# Patient Record
Sex: Male | Born: 1935 | Race: Black or African American | Hispanic: No | Marital: Married | Smoking: Never smoker
Health system: Southern US, Community
[De-identification: ages and names within clinical notes are randomized; demographics above are authoritative.]

## PROBLEM LIST (undated history)

## (undated) DIAGNOSIS — L0390 Cellulitis, unspecified: Secondary | ICD-10-CM

---

## 2002-02-28 ENCOUNTER — Ambulatory Visit (HOSPITAL_COMMUNITY): Admission: RE | Admit: 2002-02-28 | Discharge: 2002-02-28 | Payer: Self-pay | Admitting: Pulmonary Disease

## 2003-08-29 ENCOUNTER — Ambulatory Visit (HOSPITAL_COMMUNITY): Admission: RE | Admit: 2003-08-29 | Discharge: 2003-08-29 | Payer: Self-pay | Admitting: Pulmonary Disease

## 2008-02-07 ENCOUNTER — Encounter: Payer: Self-pay | Admitting: Cardiology

## 2008-02-07 ENCOUNTER — Ambulatory Visit: Payer: Self-pay | Admitting: Cardiology

## 2008-02-08 ENCOUNTER — Inpatient Hospital Stay (HOSPITAL_COMMUNITY): Admission: EM | Admit: 2008-02-08 | Discharge: 2008-02-13 | Payer: Self-pay | Admitting: Emergency Medicine

## 2008-03-23 ENCOUNTER — Ambulatory Visit (HOSPITAL_COMMUNITY): Admission: RE | Admit: 2008-03-23 | Discharge: 2008-03-23 | Payer: Self-pay | Admitting: Pulmonary Disease

## 2008-03-30 ENCOUNTER — Encounter (HOSPITAL_COMMUNITY): Admission: RE | Admit: 2008-03-30 | Discharge: 2008-04-04 | Payer: Self-pay | Admitting: Pulmonary Disease

## 2008-04-05 ENCOUNTER — Encounter (HOSPITAL_COMMUNITY): Admission: RE | Admit: 2008-04-05 | Discharge: 2008-05-05 | Payer: Self-pay | Admitting: Pulmonary Disease

## 2009-06-18 ENCOUNTER — Inpatient Hospital Stay (HOSPITAL_COMMUNITY): Admission: AD | Admit: 2009-06-18 | Discharge: 2009-06-21 | Payer: Self-pay | Admitting: Pulmonary Disease

## 2010-10-07 LAB — CBC
HCT: 34.3 % — ABNORMAL LOW (ref 39.0–52.0)
HCT: 41.1 % (ref 39.0–52.0)
Hemoglobin: 11.6 g/dL — ABNORMAL LOW (ref 13.0–17.0)
Hemoglobin: 13.7 g/dL (ref 13.0–17.0)
MCHC: 33.3 g/dL (ref 30.0–36.0)
MCHC: 33.9 g/dL (ref 30.0–36.0)
Platelets: 151 10*3/uL (ref 150–400)
RBC: 3.9 MIL/uL — ABNORMAL LOW (ref 4.22–5.81)
RDW: 14.4 % (ref 11.5–15.5)

## 2010-10-07 LAB — URINALYSIS, ROUTINE W REFLEX MICROSCOPIC
Bilirubin Urine: NEGATIVE
Glucose, UA: NEGATIVE mg/dL
Hgb urine dipstick: NEGATIVE
Specific Gravity, Urine: 1.01 (ref 1.005–1.030)
Urobilinogen, UA: 0.2 mg/dL (ref 0.0–1.0)
pH: 6 (ref 5.0–8.0)

## 2010-10-07 LAB — CULTURE, BLOOD (ROUTINE X 2)
Culture: NO GROWTH
Culture: NO GROWTH
Report Status: 12182010
Report Status: 12182010

## 2010-10-07 LAB — DIFFERENTIAL
Basophils Relative: 0 % (ref 0–1)
Basophils Relative: 0 % (ref 0–1)
Eosinophils Relative: 2 % (ref 0–5)
Lymphocytes Relative: 6 % — ABNORMAL LOW (ref 12–46)
Lymphocytes Relative: 9 % — ABNORMAL LOW (ref 12–46)
Lymphs Abs: 0.5 10*3/uL — ABNORMAL LOW (ref 0.7–4.0)
Monocytes Absolute: 0.3 10*3/uL (ref 0.1–1.0)
Monocytes Absolute: 0.7 10*3/uL (ref 0.1–1.0)
Monocytes Relative: 12 % (ref 3–12)
Monocytes Relative: 4 % (ref 3–12)
Neutro Abs: 4.9 10*3/uL (ref 1.7–7.7)
Neutro Abs: 8.2 10*3/uL — ABNORMAL HIGH (ref 1.7–7.7)
Neutrophils Relative %: 89 % — ABNORMAL HIGH (ref 43–77)

## 2010-10-07 LAB — BASIC METABOLIC PANEL
CO2: 29 mEq/L (ref 19–32)
Calcium: 8.4 mg/dL (ref 8.4–10.5)
GFR calc Af Amer: >60 mL/min (ref >60)
Glucose, Bld: 96 mg/dL (ref 70–99)
Potassium: 3.8 mEq/L (ref 3.5–5.1)
Sodium: 139 mEq/L (ref 135–145)

## 2010-10-07 LAB — COMPREHENSIVE METABOLIC PANEL
Albumin: 3.6 g/dL (ref 3.5–5.2)
Alkaline Phosphatase: 110 U/L (ref 39–117)
BUN: 9 mg/dL (ref 6–23)
Calcium: 9 mg/dL (ref 8.4–10.5)
Glucose, Bld: 154 mg/dL — ABNORMAL HIGH (ref 70–99)
Potassium: 3.7 mEq/L (ref 3.5–5.1)
Total Protein: 6.8 g/dL (ref 6.0–8.3)

## 2010-11-18 NOTE — Consult Note (Signed)
[name], [name] NO.:  [id number]   MEDICAL RECORD NO.:  [id number]          PATIENT TYPE:  OBV   LOCATION:  A321                          FACILITY:  [facility]   PHYSICIAN:  Thomas C. Wall, MD, FACCDATE OF BIRTH:  30-Dec-1934   DATE OF CONSULTATION:  DATE OF DISCHARGE:                                 CONSULTATION   I was asked by Dr. Osvaldo Shipper to consult on the patient by the name  of Luis Romero for an acute onset of weakness, imbalance and an  abnormal EKG.   HISTORY OF PRESENT ILLNESS:  He is a 76 year old very pleasant African  American gentleman who has no previous medical history.  He has been  extremely healthy as he puts it.   He awoke from sleep at which time he was experiencing some pain in his  right thigh.  He went to the restroom, but he experienced general body  aches with some back pain and pain in his neck.  He felt very unsteady  on his feet.  He had some chills, but denied any fever.   He did not have any nausea, vomiting or abdominal discomfort.  He denied  any chest pain, tachy palpitations.  He did not lose consciousness and  really was not presyncopal.   He decided to come into the emergency room.   Upon arrival here, his vital signs were stable.  He was in no acute  distress.  He was afebrile.  His white count was elevated at 13,200 with  96% neutrophils.  Chest x-ray was clear.  UA was clean.  EKG shows  normal sinus rhythm with ST-elevation in V1 and V2, which could either  be a right bundle or potentially Brugada pattern.  We were asked to see  him for the latter.   He denies any history of syncope, tachy palpitations.  He has had no  history in his family of sudden cardiac death or unexplained syncope.   He plays golf and is quite active individual.  He denies any symptoms of  angina including dyspnea on exertion.  He has had no orthopnea or PND.   In regard to the HPI, he denies any tick bites or insect bites.  He  denies any headache, recent trauma, any other neurological symptoms when  carefully questioned.   PAST MEDICAL HISTORY:  As I mentioned above has been extremely healthy.   ALLERGIES:  No medications.   He was on no medications on admission.   FAMILY HISTORY:  His father needed a pacemaker for unknown reasons.  He  has a history of stroke in his father.  His mother lived 2 years of  age with no significant medical history.  There is no significant health  care or cardiac issues in his siblings.   REVIEW OF SYSTEMS:  Other than the HPI is negative.  All systems were  questioned.   PHYSICAL EXAMINATION:  VITAL SIGNS:  His blood pressure is 110/64, his  pulse is 86 and regular, respirations 20 and regular, unlabored,  saturation 99% on room air.  His temperature  is 98.9.  GENERAL:  He is a well developed, well nourished [place] American male  in no acute distress.  He is extremely pleasant.  His daughter is  present.  He looks younger than stated age.  HEENT:  Normocephalic, atraumatic.  PERRLA.  Extraocular movements  intact.  Sclerae are clear.  Facial symmetry is normal.  Dentition is  satisfactory.  NECK:  Supple.  There is no thyroid enlargement.  Trachea is midline.  Carotids upstrokes are equal bilaterally without bruits.  No JVD.  Thyroid is not enlarged.  LUNGS:  Clear.  HEART:  A nondisplaced PMI with soft systolic murmur heard best at the  apex.  ABDOMEN:  Soft.  Good bowel sounds.  No midline bruit.  No hepatomegaly.  EXTREMITIES:  No cyanosis, clubbing or edema.  Pulses are intact.  NEURO:  Intact.  SKIN:  Unremarkable.   ASSESSMENT:  1. Sudden onset of weakness, body aches and imbalance with chills.      With elevated white count particularly with the left shift.  Will      need to rule out any potential source of bacteremia.  His chest x-      ray is okay.  His UA is okay and there is no obvious source of      infection by history and physical.  2. EKG with a  right bundle branch block pattern versus Brugada      pattern.  I doubt this is Brugada with a negative family history of      sudden cardiac death or unexplained syncope, no history of syncope      and tachy palpitations in the patient etc.  Regardless, his EKG      pattern were not explained in this presentation in my opinion.  3. Systolic murmur.   RECOMMENDATIONS:  1. Recheck EKG to rule out this being potential artifact or true right      bundle.  2. 2-D echocardiogram to assess his LV function and right-sided      function as well as his murmur.  3. Two blood cultures.  4. TSH.  5. Neurology consult.   Thank you very much for allowing me to see this patient.  We will follow  with you.      Thomas C. Daleen Squibb, MD, [facility]  Electronically Signed     TCW/MEDQ  D:  02/07/2008  T:  02/08/2008  Job:  [id number]

## 2010-11-18 NOTE — Group Therapy Note (Signed)
NAMEEROL, FLANAGIN NO.:  [id number]   MEDICAL RECORD NO.:  [id number]          PATIENT TYPE:  OBV   LOCATION:  A321                          FACILITY:  [facility]   PHYSICIAN:  Osvaldo Shipper, MD     DATE OF BIRTH:  [birth year]   DATE OF PROCEDURE:  02/08/2008  DATE OF DISCHARGE:                                 PROGRESS NOTE   SUBJECTIVE:  The patient is feeling much better today.  His pain in the  right lower extremity is improved.  His other symptoms have all  resolved.  No chest pain, no shortness of breath.   OBJECTIVE:  VITAL SIGNS:  Show that his temperature was 101.2 overnight,  heart rate in the 70s, respiratory rate 20, blood pressure 108/59,  saturation 95% on room air.  LUNGS:  Clear to auscultation bilaterally.  CARDIOVASCULAR:  S1, S2 is normal, regular.  Systolic murmur is  appreciated.  ABDOMEN:  Soft, nontender, nondistended.  He does have a 3 cm mass in  his right flank area which is soft to firm, mobile, nontender,  nonerythematous.  EXTREMITIES:  Right lower extremity:  The lymphadenopathy in the right  thigh area seems to have improved.  Yesterday evening, when I examined  the patient, he did not have evidence for lymphangitis.  He had erythema  in his right foot extending up to the leg.  The area was also tender to  palpation.  Peripheral pulses were palpable.  The erythema and warmth  have improved as well.  NEUROLOGIC:  He is alert, oriented x3.  No focal neurological deficits  are present.   LABORATORIES:  His white count is improved to 12,200, hemoglobin is  11.5, platelet count is 123.  It has gone down from 169.  His BMET is  unremarkable.  Cardiac markers negative.  UA was unremarkable.  TSH is  1.546.   ASSESSMENT AND PLAN:  1. Cellulitis of the right lower extremity.  He is on IV Levaquin,      which will be continued.  His white count is improving.  We will      await his fevers to resolve completely.  Blood cultures were sent,      and they are still pending at this time.  A Doppler study has been      ordered, which will be done today.  2. Yeast infection in his right foot in between the toes was also      noted, for which he will be prescribed nystatin powder.  3. Abnormal electrocardiogram, with possible Brugada syndrome.      Cardiology has seen him.  They think that it could be a right      bundle branch block versus Brugada pattern.  Echocardiogram has      been done and, according to the report, systolic function was      normal, EF 55%-65%.  No wall motion abnormalities, and a lot of      other nonspecific changes were noted.  Refer to cardiology to      decide additional testing in this individual.  He continues to be      on a baby aspirin.  4. Right flank mass.  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  I have explained      this to the patient.  I have asked him to monitor this area closely      and follow up with his PMD.   So, basically await his infection to improve.  Await results of the  venous Doppler.  Await further cardiology recommendation regarding his  abnormal EKG.  I am anticipating the patient should be able to go home  in the next day or two if his fevers subside and if his white count  continues to improve.      Osvaldo Shipper, MD  Electronically Signed     GK/MEDQ  D:  02/08/2008  T:  02/08/2008  Job:  [id number]   cc:   Ramon Dredge L. [name], M.D.  Fax: [phone]

## 2010-11-18 NOTE — Discharge Summary (Signed)
NAMEMING, MCMANNIS NO.:  [id number]   MEDICAL RECORD NO.:  [id number]          PATIENT TYPE:  INP   LOCATION:  A321                          FACILITY:  [facility]   PHYSICIAN:  Skeet Latch, DO    DATE OF BIRTH:  04/24/1935   DATE OF ADMISSION:  02/07/2008  DATE OF DISCHARGE:  08/10/2009LH                               DISCHARGE SUMMARY   DISCHARGE DIAGNOSES:  1. Right foot cellulitis.  2. Right flank mass.  3. Yeast infection right foot.   BRIEF HOSPITAL COURSE:  Please see H and P done by Dr. Rito Ehrlich on  February 07, 2008.  Briefly, this is a 76 year old African American man  with no medical history who was in his usual state of health suddenly  experienced some right thigh pain that felt like a knot.  Patient was  going to the rest room, he started experiencing general body aches and  back pain and pain in his neck.  Patient was unstable on his feet, had  no actual loss of consciousness.  Did experience some chills and no  chest pain.  Patient also denying nausea, vomiting or abdominal  discomfort.  Patient, after these symptoms, decided to come to the  emergency room and be evaluated.  Patient was admitted for a possible  presyncopal episode where he did have an abdominal EKG and cardiology  was consulted.  He has possible right inguinal adenopathy also.  He was  also found to have a leukocytosis.  Secondary to both findings, as  stated, cardiology was consulted for possible Brugada syndrome.  Cardiology saw the patient.  EKG was repeated.  Also had a 2-D  echocardiogram.  Workup was fairly unremarkable for Brugada syndrome and  cardiology had released the patient.  Patient also started to have right  lower leg/foot cellulitis.  He was started on vancomycin as well as  Levaquin.  He was continued on these IV antibiotics and his cellulitis  continued to improve with warm compresses and elevation of his lower  extremity.  Patient is also found to have a right  flank mass, what is  most likely a lymphoma.  This needs to be followup closely with his  primary care physician.  Patient did have Doppler study performed on his  right lower extremity which was unremarkable.  He was also found to have  flight yeast infection between his toes on the right foot where he has  cellulitis and was started on nystatin powder.  At this time we feel  patient is stable enough to be discharged at home with IV antibiotics  for the next few days as well as some p.o. antibiotics.  The patient  also had a chest x-ray; initially it showed no acute disease.  Patient  will be sent home with the following medications:  1. Vancomycin 1000 mg twice a day for 3 days.  2. Levaquin 500 mg once a day for 3 days.  3. Patient can also do over-the-counter Lamisil on his toes until his      yeast infection resolves.   Vitals on discharge:  Temperature is 97.5.  Pulse 64.  Respirations 18.  Blood pressure 131/81.  Satting 97% on room air.   LABS ON DISCHARGE:  Supplied blood cultures are unremarkable.  White  count is 5.9, hemoglobin 11.2, hematocrit 33.7, platelet count is 180.   CONDITION AT DISCHARGE:  Stable.   DISPOSITION:  Patient will be discharged to home.   DISCHARGE INSTRUCTIONS:  Patient to maintain a regular diet.  Patient to  resume his normal activities.  He is to elevate his right foot/leg when  he is seated.  Patient to follow with Dr. [name] within the next 7  days.  The patient will need workup followup for his right trunk mass by  Dr. [name] also.  Patient is also to continue warm compresses 2-3 times  a day onto the right foot/leg until his swelling is resolved.  Patient  will be sent home with [facility] for his IV antibiotics.      Skeet Latch, DO  Electronically Signed     SM/MEDQ  D:  02/13/2008  T:  02/13/2008  Job:  [id number]   cc:   [name], DR.

## 2010-11-18 NOTE — Group Therapy Note (Signed)
NAMELEMARIO, CHAIKIN NO.:  [id number]   MEDICAL RECORD NO.:  [id number]          PATIENT TYPE:  INP   LOCATION:  A321                          FACILITY:  [facility]   PHYSICIAN:  Skeet Latch, DO    DATE OF BIRTH:  [birth year]   DATE OF PROCEDURE:  02/09/2008  DATE OF DISCHARGE:                                 PROGRESS NOTE   SUBJECTIVE:  Patient continues to improve.  The right lower  extremity/foot has decreased swelling and pain.  Patient denies any  shortness of breath, abdominal pain, nausea or vomiting.   OBJECTIVE:  VITAL SIGNS:  Temperature 98.3, pulse 67, respirations 20,  blood pressure 133/85.  Satting 97% on room air.  CARDIOVASCULAR:  S1 and S2 is regular.  LUNGS:  Clear to auscultation bilaterally.  No rales, wheezes, or  rhonchi.  ABDOMEN:  Soft, nontender, nondistended.  He does have a mass in the  right flank area.  It is soft, firm, mobile, nontender.  EXTREMITIES:  Right lower extremity:  Decreased swelling.  Decreased  redness.  Still has significant edema in his right foot.  It is tender  to palpation to peripheral pulses.  NEUROLOGIC:  Cranial nerves II-XII are grossly intact.  He is alert and  oriented x3.   LABS:  White count is 10.1, hemoglobin 11.1, hematocrit 33.5, platelet  count 121.   ASSESSMENT/PLAN:  1. Cellulitis, right lower extremity:  Patient continues to be on IV      antibiotics.  Vancomycin has been added.  Patient continues to be      afebrile.  So far, his blood cultures are unremarkable.  We will      continue to follow.  His Doppler study showed no evidence of deep      venous thrombosis at this time.  2. Yeast infection in his right foot:  Patient continues on nystatin      powder.  3. Abnormal electrocardiogram:  Cardiology saw patient.  Did not feel      this was consistent with Brugada syndrome.  They have no further      recommendations at this time.  4. Right flank mass, most likely lipoma.  Will refer to his  primary      care physician to have followup regarding this at this time.  5. We will continue with IV antibiotics, continue with supportive      measures with elevation of the lower extremity.      Skeet Latch, DO  Electronically Signed     SM/MEDQ  D:  02/09/2008  T:  02/09/2008  Job:  [id number]

## 2010-11-18 NOTE — H&P (Signed)
Luis Romero, Luis Romero NO.:  [id number]   MEDICAL RECORD NO.:  [id number]          PATIENT TYPE:  EMS   LOCATION:  ED                            FACILITY:  [facility]   PHYSICIAN:  Osvaldo Shipper, MD     DATE OF BIRTH:  07/01/1935   DATE OF ADMISSION:  02/07/2008  DATE OF DISCHARGE:  LH                              HISTORY & PHYSICAL   PRIMARY MEDICAL DOCTOR:  Oneal Deputy. Luis Romero, M.D.   ADMITTING DIAGNOSES:  1. Possible Brugada syndrome on electrocardiogram.  2. Presyncope.   CHIEF COMPLAINT:  Not feeling well.   HISTORY OF PRESENT ILLNESS:  The patient is a 76 year old African  American male who does not have any medical problems, is on no home  medications, who was in his usual state of health until earlier today  when he experienced some pain in his right thigh, and he felt a knot in  his right thigh area.  Then, when he was going to his rest room at home,  he experienced generalized body ache with back pain and pain in the  neck.  He felt a little bit unsteady on his feet.  He denies passing  out.  Denies any fever.  Did experience some chills.  Denies any sick  contacts.  Denies any nausea or vomiting or any abdominal discomfort.  The patient was disconcerted by these symptoms and decided to come into  the hospital.  The patient mentioned that he feels slightly improved  with respect to his other symptoms.  He mentions that the knot in his  mass of the lesion in his right thigh he noticed only today.  He had not  felt this area in the last few days. He also mentions that he has issues  with lower extremity edema for the last couple of years.   MEDICATIONS AT HOME:  None.   ALLERGIES:  None.   PAST MEDICAL HISTORY:  Really unremarkable, except for swollen ankles  for the past couple of years, for which he has been evaluated by his  PMD, and no real etiology has been found.  Denies any surgical history.   SOCIAL HISTORY:  Lives in [place] with his wife.   He is retired.  He  used to work for the [employer] in [place], [place].  He denies  smoking.  No alcohol use, no illicit drug use.  Quite active.  He walks  on the treadmill.   FAMILY HISTORY:  Positive for need for pacemaker in his father for  unclear reasons.  History of stroke in his father.  His mother lived up  to 2, with no significant medical history.  Otherwise, no other  significant problems in his siblings.   REVIEW OF SYSTEMS:  GENERAL:  Positive for weakness, malaise.  Otherwise  unremarkable.  CARDIOVASCULAR:  As in HPI.  RESPIRATORY:  As in HPI.  GI:  Unremarkable.  GU:  Unremarkable.  MUSCULOSKELETAL:  Unremarkable.  NEUROLOGIC:  Unremarkable.  PSYCHIATRIC:  Unremarkable.   PHYSICAL EXAMINATION:  VITAL SIGNS:  Temperature is 98.9, blood pressure  is 110/64,  heart rate 86, respiratory rate 20, saturation 99% on room  air.  GENERAL:  This is a well-developed, well-nourished male, in no distress.  HEENT:  There is no pallor, no icterus.  Oral mucous membranes moist.  No oral lesions are noted.  NECK:  Soft and supple.  No thyromegaly is appreciated.  LYMPHATIC:  No cervical or axillary lymphadenopathy is appreciated.  The  region of the right upper thigh reveals a possible enlarged lymph node.  The area is slightly warm to touch, and the lesion is tender to  palpation, although I am not quite conclusively sure if this is a lymph  node or not.  CARDIOVASCULAR:  Normal, regular.  No murmurs appreciated.  RESPIRATORY:  Clear to auscultation bilaterally.  No wheezing, rales, or  rhonchi.  ABDOMEN:  Soft, nontender, nondistended.  Bowel sounds are present.  No  mass or organomegaly is appreciated.  EXTREMITIES:  Show no edema or erythema.   LABORATORY DATA:  His white count is 30,200, hemoglobin is 12.8,  platelet count is 169, 96% neutrophils identified.  His BMET was  unremarkable.  Cardiac markers negative x1.  UA was unremarkable.  He  has not had any imaging  studies done.  His EKG shows sinus rhythm, with  normal axis.  There is evidence for some ST elevation in V1 and V2,  appears to be a Brugada syndrome type of presentation.   ASSESSMENT:  This is a 76 year old African American male who presents  with vague symptoms and is found to have an abnormal EKG.  He denies any  chest pain at this time.  He also has a possible right inguinal  lymphadenopathy.   PLAN:  1. Possible presyncopal episode, with abnormal EKG.  Will observe him      in the hospital, do serial cardiac markers.  Consult [facility] to evaluate him for this abnormal EKG.  Will also do      another EKG on this gentleman.  Fasting lipid profile will be      checked.  Will put him on aspirin.  No other medications will be      initiated at this time.  DVT prophylaxis will be given.  2. Right inguinal lymphadenopathy.  We will continue to monitor this      area.  If the patient experiences more pain and discomfort, an      ultrasound may be considered to further evaluate this lesion.  3. Leukocytosis, etiology unclear.  Will continue to monitor..  4. So, mainly we await cardiology input regarding his abnormal EKG.      Osvaldo Shipper, MD  Electronically Signed     GK/MEDQ  D:  02/07/2008  T:  02/07/2008  Job:  [id number]   cc:   [facility]   Ramon Dredge L. Luis Romero, M.D.  Fax: [phone]

## 2011-04-03 LAB — DIFFERENTIAL
Basophils Absolute: 0
Basophils Absolute: 0
Basophils Absolute: 0
Basophils Relative: 0
Basophils Relative: 0
Basophils Relative: 0
Eosinophils Absolute: 0
Eosinophils Relative: 0
Lymphocytes Relative: 11 — ABNORMAL LOW
Lymphocytes Relative: 6 — ABNORMAL LOW
Lymphocytes Relative: 6 — ABNORMAL LOW
Lymphocytes Relative: 9 — ABNORMAL LOW
Lymphs Abs: 0.5 — ABNORMAL LOW
Lymphs Abs: 0.6 — ABNORMAL LOW
Monocytes Absolute: 0.4
Monocytes Absolute: 0.5
Monocytes Absolute: 0.5
Monocytes Absolute: 0.6
Monocytes Absolute: 0.6
Monocytes Absolute: 0.7
Monocytes Relative: 3
Monocytes Relative: 8
Monocytes Relative: 8
Monocytes Relative: 9
Neutro Abs: 11.1 — ABNORMAL HIGH
Neutro Abs: 4.6
Neutro Abs: 4.7
Neutro Abs: 7
Neutro Abs: 8.8 — ABNORMAL HIGH
Neutrophils Relative %: 87 — ABNORMAL HIGH
Neutrophils Relative %: 91 — ABNORMAL HIGH

## 2011-04-03 LAB — FERRITIN: Ferritin: 210 (ref 22–322)

## 2011-04-03 LAB — CULTURE, BLOOD (ROUTINE X 2)
Culture: NO GROWTH
Culture: NO GROWTH

## 2011-04-03 LAB — URINE CULTURE
Colony Count: NO GROWTH
Culture: NO GROWTH
Special Requests: NEGATIVE

## 2011-04-03 LAB — BASIC METABOLIC PANEL
CO2: 28
CO2: 31
Calcium: 8.3 — ABNORMAL LOW
Chloride: 105
Creatinine, Ser: 1.09
GFR calc Af Amer: >60
Glucose, Bld: 108 — ABNORMAL HIGH
Sodium: 139

## 2011-04-03 LAB — URINALYSIS, ROUTINE W REFLEX MICROSCOPIC
Bilirubin Urine: NEGATIVE
Bilirubin Urine: NEGATIVE
Glucose, UA: NEGATIVE
Glucose, UA: NEGATIVE
Hgb urine dipstick: NEGATIVE
Hgb urine dipstick: NEGATIVE
Ketones, ur: NEGATIVE
Nitrite: NEGATIVE
Protein, ur: NEGATIVE
Specific Gravity, Urine: 1.01
Specific Gravity, Urine: 1.01
Urobilinogen, UA: 0.2
pH: 6

## 2011-04-03 LAB — CBC
HCT: 36.2 — ABNORMAL LOW
HCT: 38.3 — ABNORMAL LOW
Hemoglobin: 11.1 — ABNORMAL LOW
Hemoglobin: 11.1 — ABNORMAL LOW
Hemoglobin: 11.2 — ABNORMAL LOW
Hemoglobin: 12.8 — ABNORMAL LOW
MCHC: 32.8
MCHC: 33
MCHC: 33.1
MCHC: 33.2
MCHC: 33.6
MCV: 87.3
MCV: 88.6
RBC: 3.83 — ABNORMAL LOW
RBC: 3.85 — ABNORMAL LOW
RBC: 4.39
RDW: 14.4
RDW: 14.4
RDW: 14.5
WBC: 8.2

## 2011-04-03 LAB — IRON AND TIBC
Saturation Ratios: 35
TIBC: 188 — ABNORMAL LOW
UIBC: 122

## 2011-04-03 LAB — POCT CARDIAC MARKERS
Troponin i, poc: <0.05
Troponin i, poc: <0.05

## 2011-04-03 LAB — CARDIAC PANEL(CRET KIN+CKTOT+MB+TROPI)
CK, MB: 0.6
Relative Index: INVALID
Total CK: 119
Troponin I: <0.01

## 2011-04-03 LAB — LIPID PANEL
Cholesterol: 110
HDL: 54
Triglycerides: 59

## 2011-04-03 LAB — RETICULOCYTES: Retic Ct Pct: 1.8

## 2011-04-03 LAB — VITAMIN B12: Vitamin B-12: 176 — ABNORMAL LOW (ref 211–911)

## 2011-04-03 LAB — FOLATE: Folate: 16.1

## 2011-08-05 DIAGNOSIS — N390 Urinary tract infection, site not specified: Secondary | ICD-10-CM | POA: Diagnosis not present

## 2011-09-22 DIAGNOSIS — K6289 Other specified diseases of anus and rectum: Secondary | ICD-10-CM | POA: Diagnosis not present

## 2011-09-22 DIAGNOSIS — J309 Allergic rhinitis, unspecified: Secondary | ICD-10-CM | POA: Diagnosis not present

## 2011-09-22 DIAGNOSIS — B353 Tinea pedis: Secondary | ICD-10-CM | POA: Diagnosis not present

## 2011-10-20 DIAGNOSIS — J309 Allergic rhinitis, unspecified: Secondary | ICD-10-CM | POA: Diagnosis not present

## 2011-10-20 DIAGNOSIS — K6289 Other specified diseases of anus and rectum: Secondary | ICD-10-CM | POA: Diagnosis not present

## 2011-10-20 DIAGNOSIS — B353 Tinea pedis: Secondary | ICD-10-CM | POA: Diagnosis not present

## 2011-10-23 DIAGNOSIS — Z79899 Other long term (current) drug therapy: Secondary | ICD-10-CM | POA: Diagnosis not present

## 2011-12-02 DIAGNOSIS — Z1331 Encounter for screening for depression: Secondary | ICD-10-CM | POA: Diagnosis not present

## 2011-12-02 DIAGNOSIS — J309 Allergic rhinitis, unspecified: Secondary | ICD-10-CM | POA: Diagnosis not present

## 2011-12-02 DIAGNOSIS — Z79899 Other long term (current) drug therapy: Secondary | ICD-10-CM | POA: Diagnosis not present

## 2011-12-02 DIAGNOSIS — B353 Tinea pedis: Secondary | ICD-10-CM | POA: Diagnosis not present

## 2011-12-02 DIAGNOSIS — Z0000 Encounter for general adult medical examination without abnormal findings: Secondary | ICD-10-CM | POA: Diagnosis not present

## 2011-12-02 DIAGNOSIS — K6289 Other specified diseases of anus and rectum: Secondary | ICD-10-CM | POA: Diagnosis not present

## 2012-04-01 DIAGNOSIS — Z23 Encounter for immunization: Secondary | ICD-10-CM | POA: Diagnosis not present

## 2012-04-04 DIAGNOSIS — H9190 Unspecified hearing loss, unspecified ear: Secondary | ICD-10-CM | POA: Diagnosis not present

## 2012-04-04 DIAGNOSIS — H60399 Other infective otitis externa, unspecified ear: Secondary | ICD-10-CM | POA: Diagnosis not present

## 2012-04-04 DIAGNOSIS — H6120 Impacted cerumen, unspecified ear: Secondary | ICD-10-CM | POA: Diagnosis not present

## 2012-04-18 DIAGNOSIS — H60399 Other infective otitis externa, unspecified ear: Secondary | ICD-10-CM | POA: Diagnosis not present

## 2012-04-18 DIAGNOSIS — H9190 Unspecified hearing loss, unspecified ear: Secondary | ICD-10-CM | POA: Diagnosis not present

## 2012-04-18 DIAGNOSIS — H6120 Impacted cerumen, unspecified ear: Secondary | ICD-10-CM | POA: Diagnosis not present

## 2012-12-29 DIAGNOSIS — H40019 Open angle with borderline findings, low risk, unspecified eye: Secondary | ICD-10-CM | POA: Diagnosis not present

## 2012-12-29 DIAGNOSIS — H524 Presbyopia: Secondary | ICD-10-CM | POA: Diagnosis not present

## 2013-01-09 DIAGNOSIS — H4011X0 Primary open-angle glaucoma, stage unspecified: Secondary | ICD-10-CM | POA: Diagnosis not present

## 2013-02-23 DIAGNOSIS — H4010X0 Unspecified open-angle glaucoma, stage unspecified: Secondary | ICD-10-CM | POA: Diagnosis not present

## 2013-03-30 DIAGNOSIS — Z23 Encounter for immunization: Secondary | ICD-10-CM | POA: Diagnosis not present

## 2013-05-15 DIAGNOSIS — Z1331 Encounter for screening for depression: Secondary | ICD-10-CM | POA: Diagnosis not present

## 2013-05-15 DIAGNOSIS — J309 Allergic rhinitis, unspecified: Secondary | ICD-10-CM | POA: Diagnosis not present

## 2013-05-15 DIAGNOSIS — Z79899 Other long term (current) drug therapy: Secondary | ICD-10-CM | POA: Diagnosis not present

## 2013-05-15 DIAGNOSIS — B353 Tinea pedis: Secondary | ICD-10-CM | POA: Diagnosis not present

## 2013-05-15 DIAGNOSIS — L2089 Other atopic dermatitis: Secondary | ICD-10-CM | POA: Diagnosis not present

## 2013-05-15 DIAGNOSIS — Z0000 Encounter for general adult medical examination without abnormal findings: Secondary | ICD-10-CM | POA: Diagnosis not present

## 2013-05-15 DIAGNOSIS — K594 Anal spasm: Secondary | ICD-10-CM | POA: Diagnosis not present

## 2013-09-07 DIAGNOSIS — H4011X0 Primary open-angle glaucoma, stage unspecified: Secondary | ICD-10-CM | POA: Diagnosis not present

## 2013-09-11 DIAGNOSIS — J019 Acute sinusitis, unspecified: Secondary | ICD-10-CM | POA: Diagnosis not present

## 2013-09-28 ENCOUNTER — Other Ambulatory Visit: Payer: Self-pay | Admitting: Nurse Practitioner

## 2013-09-28 ENCOUNTER — Ambulatory Visit
Admission: RE | Admit: 2013-09-28 | Discharge: 2013-09-28 | Disposition: A | Discharge status: Home / Self Care | Payer: Medicare Other | Source: Ambulatory Visit | Attending: Nurse Practitioner | Admitting: Nurse Practitioner

## 2013-09-28 DIAGNOSIS — M25569 Pain in unspecified knee: Secondary | ICD-10-CM | POA: Diagnosis not present

## 2013-09-28 DIAGNOSIS — M25469 Effusion, unspecified knee: Secondary | ICD-10-CM | POA: Diagnosis not present

## 2013-09-29 DIAGNOSIS — M25569 Pain in unspecified knee: Secondary | ICD-10-CM | POA: Diagnosis not present

## 2013-10-04 DIAGNOSIS — M25569 Pain in unspecified knee: Secondary | ICD-10-CM | POA: Diagnosis not present

## 2013-10-04 DIAGNOSIS — M6281 Muscle weakness (generalized): Secondary | ICD-10-CM | POA: Diagnosis not present

## 2013-10-04 DIAGNOSIS — R262 Difficulty in walking, not elsewhere classified: Secondary | ICD-10-CM | POA: Diagnosis not present

## 2013-10-09 DIAGNOSIS — R262 Difficulty in walking, not elsewhere classified: Secondary | ICD-10-CM | POA: Diagnosis not present

## 2013-10-09 DIAGNOSIS — M25569 Pain in unspecified knee: Secondary | ICD-10-CM | POA: Diagnosis not present

## 2013-10-09 DIAGNOSIS — M6281 Muscle weakness (generalized): Secondary | ICD-10-CM | POA: Diagnosis not present

## 2013-10-11 DIAGNOSIS — M6281 Muscle weakness (generalized): Secondary | ICD-10-CM | POA: Diagnosis not present

## 2013-10-11 DIAGNOSIS — M25569 Pain in unspecified knee: Secondary | ICD-10-CM | POA: Diagnosis not present

## 2013-10-11 DIAGNOSIS — R262 Difficulty in walking, not elsewhere classified: Secondary | ICD-10-CM | POA: Diagnosis not present

## 2013-10-16 DIAGNOSIS — R262 Difficulty in walking, not elsewhere classified: Secondary | ICD-10-CM | POA: Diagnosis not present

## 2013-10-16 DIAGNOSIS — M25569 Pain in unspecified knee: Secondary | ICD-10-CM | POA: Diagnosis not present

## 2013-10-16 DIAGNOSIS — M6281 Muscle weakness (generalized): Secondary | ICD-10-CM | POA: Diagnosis not present

## 2013-10-18 DIAGNOSIS — R262 Difficulty in walking, not elsewhere classified: Secondary | ICD-10-CM | POA: Diagnosis not present

## 2013-10-18 DIAGNOSIS — M6281 Muscle weakness (generalized): Secondary | ICD-10-CM | POA: Diagnosis not present

## 2013-10-18 DIAGNOSIS — M25569 Pain in unspecified knee: Secondary | ICD-10-CM | POA: Diagnosis not present

## 2013-10-23 DIAGNOSIS — M25569 Pain in unspecified knee: Secondary | ICD-10-CM | POA: Diagnosis not present

## 2013-10-23 DIAGNOSIS — M6281 Muscle weakness (generalized): Secondary | ICD-10-CM | POA: Diagnosis not present

## 2013-10-23 DIAGNOSIS — R262 Difficulty in walking, not elsewhere classified: Secondary | ICD-10-CM | POA: Diagnosis not present

## 2013-10-25 DIAGNOSIS — R262 Difficulty in walking, not elsewhere classified: Secondary | ICD-10-CM | POA: Diagnosis not present

## 2013-10-25 DIAGNOSIS — M6281 Muscle weakness (generalized): Secondary | ICD-10-CM | POA: Diagnosis not present

## 2013-10-25 DIAGNOSIS — M25569 Pain in unspecified knee: Secondary | ICD-10-CM | POA: Diagnosis not present

## 2013-10-30 DIAGNOSIS — M25569 Pain in unspecified knee: Secondary | ICD-10-CM | POA: Diagnosis not present

## 2013-10-30 DIAGNOSIS — R262 Difficulty in walking, not elsewhere classified: Secondary | ICD-10-CM | POA: Diagnosis not present

## 2013-10-30 DIAGNOSIS — M6281 Muscle weakness (generalized): Secondary | ICD-10-CM | POA: Diagnosis not present

## 2013-11-01 DIAGNOSIS — M25569 Pain in unspecified knee: Secondary | ICD-10-CM | POA: Diagnosis not present

## 2013-11-01 DIAGNOSIS — R262 Difficulty in walking, not elsewhere classified: Secondary | ICD-10-CM | POA: Diagnosis not present

## 2013-11-01 DIAGNOSIS — M6281 Muscle weakness (generalized): Secondary | ICD-10-CM | POA: Diagnosis not present

## 2013-12-12 DIAGNOSIS — H4011X0 Primary open-angle glaucoma, stage unspecified: Secondary | ICD-10-CM | POA: Diagnosis not present

## 2014-01-02 DIAGNOSIS — H4011X0 Primary open-angle glaucoma, stage unspecified: Secondary | ICD-10-CM | POA: Diagnosis not present

## 2014-01-16 DIAGNOSIS — L301 Dyshidrosis [pompholyx]: Secondary | ICD-10-CM | POA: Diagnosis not present

## 2014-03-27 DIAGNOSIS — Z23 Encounter for immunization: Secondary | ICD-10-CM | POA: Diagnosis not present

## 2014-05-18 DIAGNOSIS — B353 Tinea pedis: Secondary | ICD-10-CM | POA: Diagnosis not present

## 2014-05-18 DIAGNOSIS — L301 Dyshidrosis [pompholyx]: Secondary | ICD-10-CM | POA: Diagnosis not present

## 2014-05-18 DIAGNOSIS — N400 Enlarged prostate without lower urinary tract symptoms: Secondary | ICD-10-CM | POA: Diagnosis not present

## 2014-05-18 DIAGNOSIS — Z0001 Encounter for general adult medical examination with abnormal findings: Secondary | ICD-10-CM | POA: Diagnosis not present

## 2014-05-18 DIAGNOSIS — Z1389 Encounter for screening for other disorder: Secondary | ICD-10-CM | POA: Diagnosis not present

## 2014-05-18 DIAGNOSIS — Z79899 Other long term (current) drug therapy: Secondary | ICD-10-CM | POA: Diagnosis not present

## 2014-05-18 DIAGNOSIS — K594 Anal spasm: Secondary | ICD-10-CM | POA: Diagnosis not present

## 2014-05-18 DIAGNOSIS — Z23 Encounter for immunization: Secondary | ICD-10-CM | POA: Diagnosis not present

## 2014-05-22 DIAGNOSIS — Z79899 Other long term (current) drug therapy: Secondary | ICD-10-CM | POA: Diagnosis not present

## 2014-05-22 DIAGNOSIS — Z136 Encounter for screening for cardiovascular disorders: Secondary | ICD-10-CM | POA: Diagnosis not present

## 2014-05-22 DIAGNOSIS — N400 Enlarged prostate without lower urinary tract symptoms: Secondary | ICD-10-CM | POA: Diagnosis not present

## 2014-05-22 DIAGNOSIS — Z0000 Encounter for general adult medical examination without abnormal findings: Secondary | ICD-10-CM | POA: Diagnosis not present

## 2014-06-18 DIAGNOSIS — H4011X1 Primary open-angle glaucoma, mild stage: Secondary | ICD-10-CM | POA: Diagnosis not present

## 2014-07-04 DIAGNOSIS — N419 Inflammatory disease of prostate, unspecified: Secondary | ICD-10-CM | POA: Diagnosis not present

## 2014-10-03 DIAGNOSIS — R3915 Urgency of urination: Secondary | ICD-10-CM | POA: Diagnosis not present

## 2014-10-03 DIAGNOSIS — R300 Dysuria: Secondary | ICD-10-CM | POA: Diagnosis not present

## 2014-10-03 DIAGNOSIS — R319 Hematuria, unspecified: Secondary | ICD-10-CM | POA: Diagnosis not present

## 2014-10-03 DIAGNOSIS — R32 Unspecified urinary incontinence: Secondary | ICD-10-CM | POA: Diagnosis not present

## 2014-10-26 IMAGING — CR DG KNEE 1-2V*L*
2 series · 2 of 2 positions shown · non-contrast
Comparison: None.

CLINICAL DATA: Knee pain

EXAM:
LEFT KNEE - 1-2 VIEW

[t knee ap left]
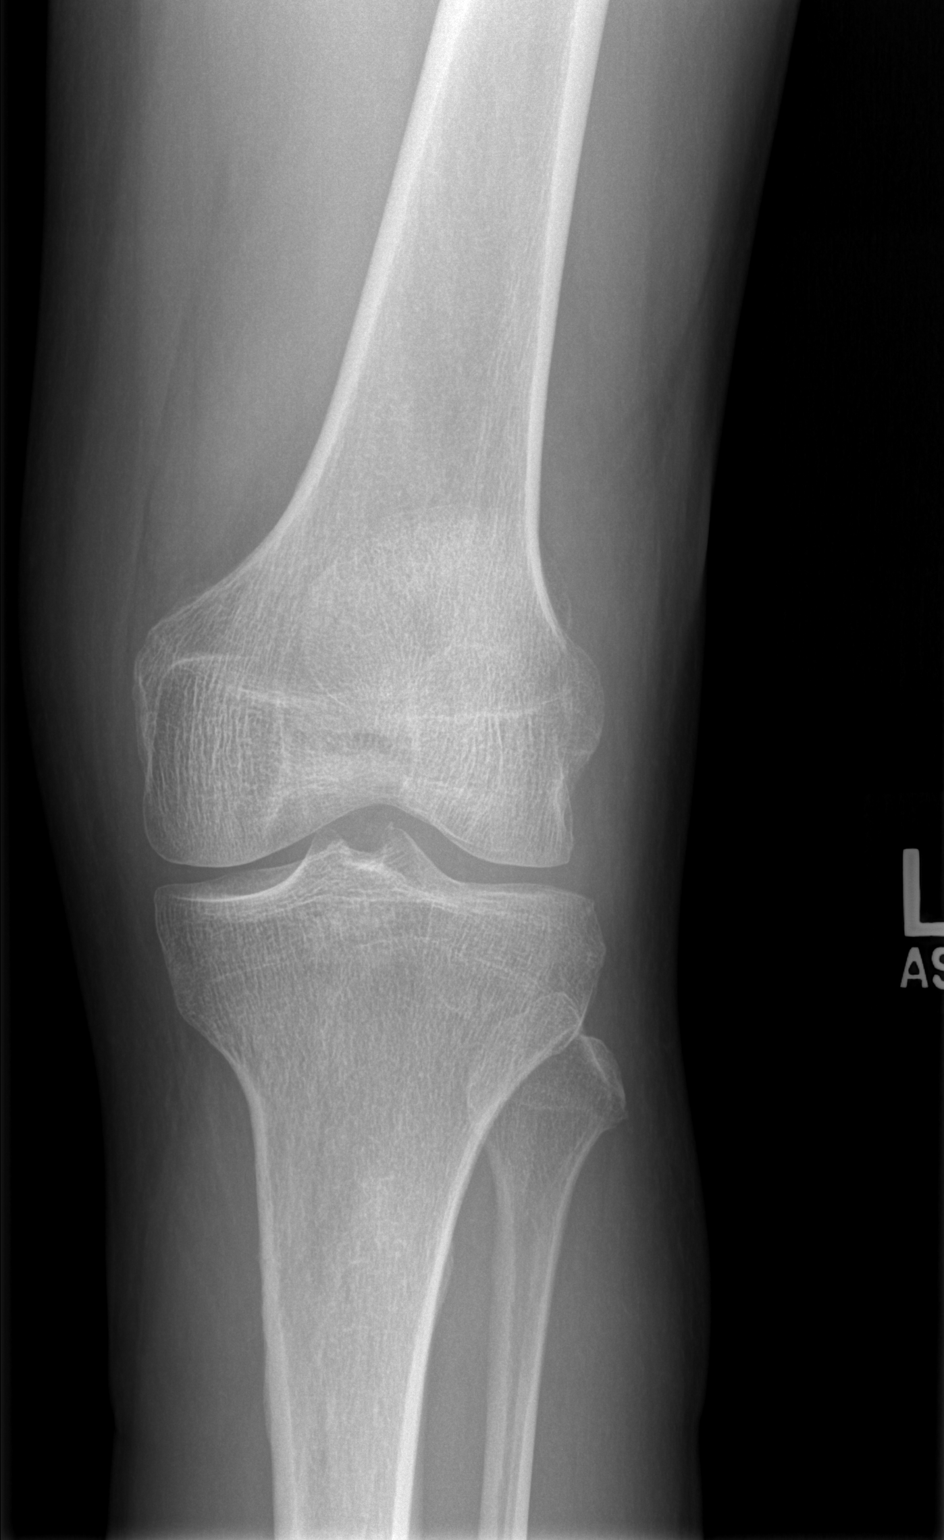

[t knee lat left]
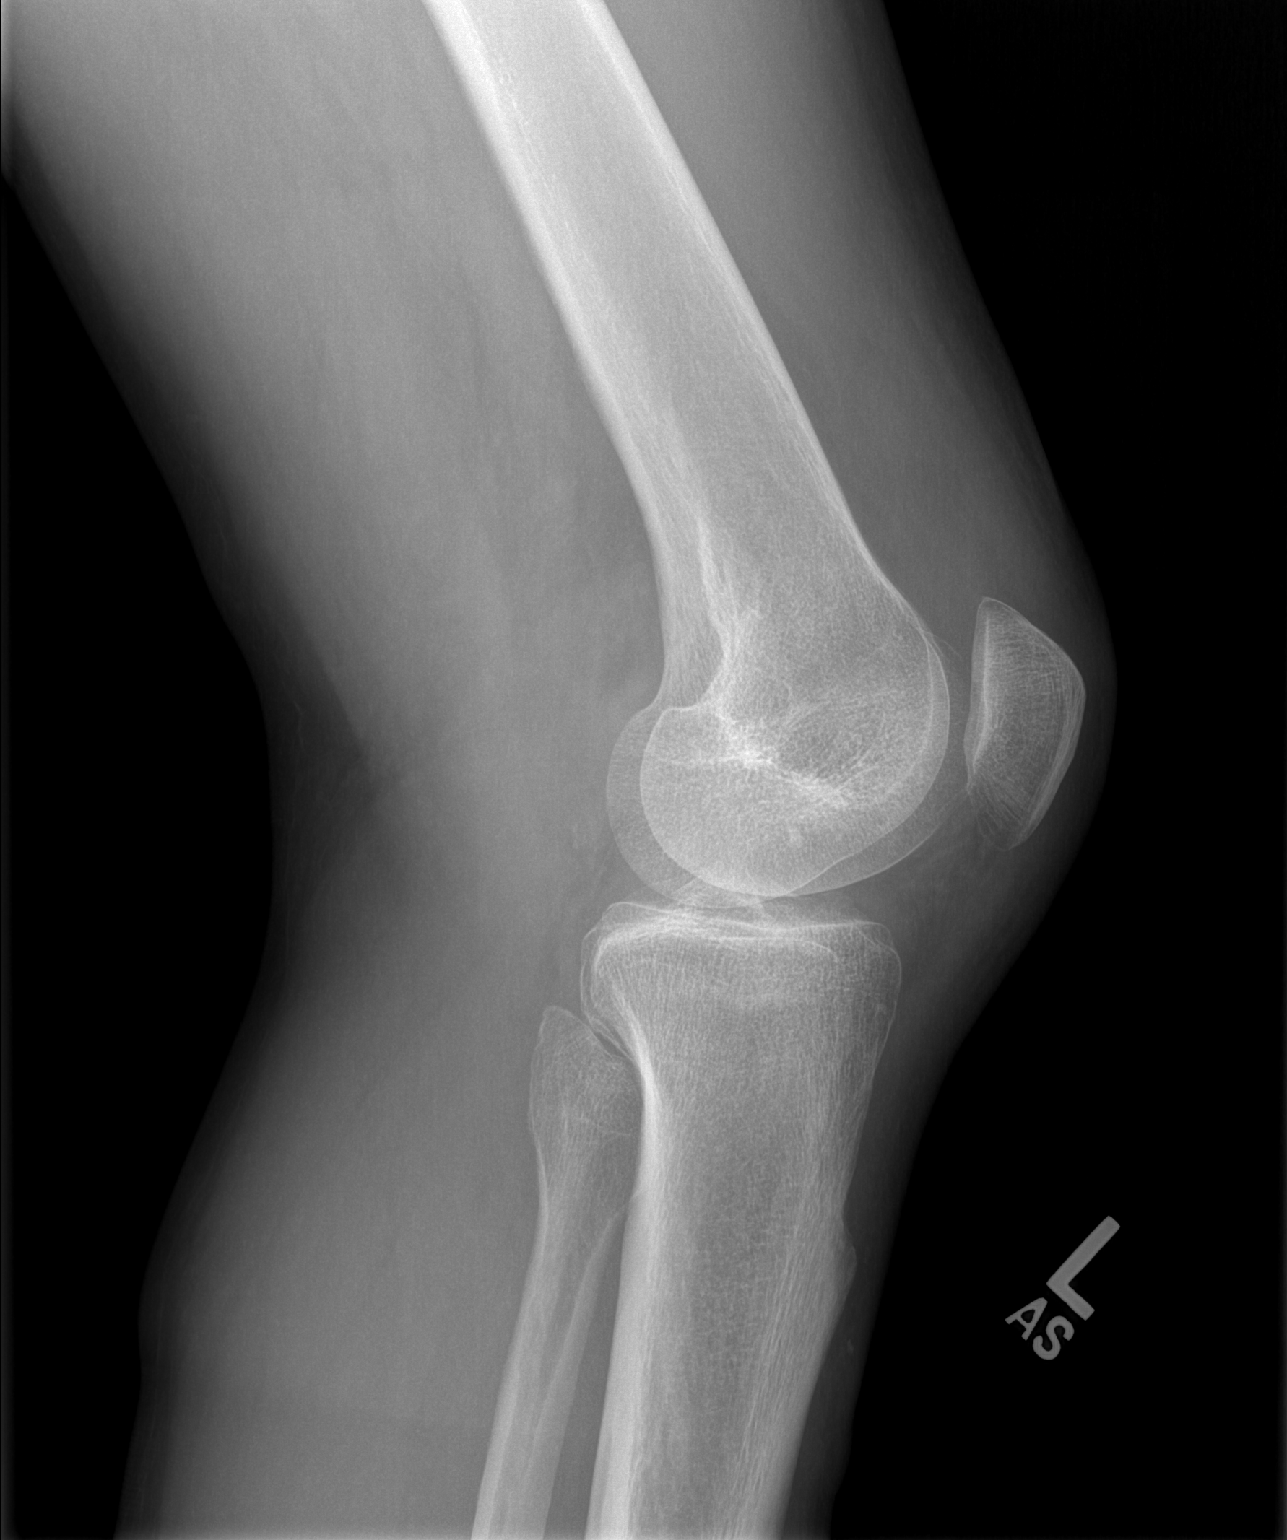

[2 of 2 positions shown; findings below may reference images not displayed]

FINDINGS: Two views of the left knee submitted. No acute fracture or
subluxation. Small joint effusion. Mild narrowing of patellofemoral
joint space.
IMPRESSION: No acute fracture or subluxation.  Small joint effusion.

## 2014-10-29 DIAGNOSIS — N138 Other obstructive and reflux uropathy: Secondary | ICD-10-CM | POA: Diagnosis not present

## 2014-10-29 DIAGNOSIS — R310 Gross hematuria: Secondary | ICD-10-CM | POA: Diagnosis not present

## 2014-10-29 DIAGNOSIS — R339 Retention of urine, unspecified: Secondary | ICD-10-CM | POA: Diagnosis not present

## 2014-10-29 DIAGNOSIS — R972 Elevated prostate specific antigen [PSA]: Secondary | ICD-10-CM | POA: Diagnosis not present

## 2014-11-02 DIAGNOSIS — R32 Unspecified urinary incontinence: Secondary | ICD-10-CM | POA: Diagnosis not present

## 2014-11-02 DIAGNOSIS — R3915 Urgency of urination: Secondary | ICD-10-CM | POA: Diagnosis not present

## 2014-11-02 DIAGNOSIS — R319 Hematuria, unspecified: Secondary | ICD-10-CM | POA: Diagnosis not present

## 2014-11-02 DIAGNOSIS — R972 Elevated prostate specific antigen [PSA]: Secondary | ICD-10-CM | POA: Diagnosis not present

## 2014-11-02 DIAGNOSIS — R300 Dysuria: Secondary | ICD-10-CM | POA: Diagnosis not present

## 2014-11-16 DIAGNOSIS — N3020 Other chronic cystitis without hematuria: Secondary | ICD-10-CM | POA: Diagnosis not present

## 2014-11-16 DIAGNOSIS — R339 Retention of urine, unspecified: Secondary | ICD-10-CM | POA: Diagnosis not present

## 2014-11-16 DIAGNOSIS — N1330 Unspecified hydronephrosis: Secondary | ICD-10-CM | POA: Diagnosis not present

## 2014-11-16 DIAGNOSIS — R310 Gross hematuria: Secondary | ICD-10-CM | POA: Diagnosis not present

## 2014-12-05 DIAGNOSIS — N323 Diverticulum of bladder: Secondary | ICD-10-CM | POA: Diagnosis not present

## 2014-12-05 DIAGNOSIS — R972 Elevated prostate specific antigen [PSA]: Secondary | ICD-10-CM | POA: Diagnosis not present

## 2014-12-05 DIAGNOSIS — N3289 Other specified disorders of bladder: Secondary | ICD-10-CM | POA: Diagnosis not present

## 2014-12-05 DIAGNOSIS — N1330 Unspecified hydronephrosis: Secondary | ICD-10-CM | POA: Diagnosis not present

## 2014-12-05 DIAGNOSIS — R339 Retention of urine, unspecified: Secondary | ICD-10-CM | POA: Diagnosis not present

## 2015-01-28 DIAGNOSIS — R972 Elevated prostate specific antigen [PSA]: Secondary | ICD-10-CM | POA: Diagnosis not present

## 2015-02-04 DIAGNOSIS — R339 Retention of urine, unspecified: Secondary | ICD-10-CM | POA: Diagnosis not present

## 2015-02-04 DIAGNOSIS — R972 Elevated prostate specific antigen [PSA]: Secondary | ICD-10-CM | POA: Diagnosis not present

## 2015-03-26 DIAGNOSIS — Z23 Encounter for immunization: Secondary | ICD-10-CM | POA: Diagnosis not present

## 2015-04-23 DIAGNOSIS — H43812 Vitreous degeneration, left eye: Secondary | ICD-10-CM | POA: Diagnosis not present

## 2015-04-23 DIAGNOSIS — H2513 Age-related nuclear cataract, bilateral: Secondary | ICD-10-CM | POA: Diagnosis not present

## 2015-04-23 DIAGNOSIS — H401124 Primary open-angle glaucoma, left eye, indeterminate stage: Secondary | ICD-10-CM | POA: Diagnosis not present

## 2015-04-23 DIAGNOSIS — H524 Presbyopia: Secondary | ICD-10-CM | POA: Diagnosis not present

## 2015-05-27 DIAGNOSIS — H401111 Primary open-angle glaucoma, right eye, mild stage: Secondary | ICD-10-CM | POA: Diagnosis not present

## 2015-05-27 DIAGNOSIS — H2513 Age-related nuclear cataract, bilateral: Secondary | ICD-10-CM | POA: Diagnosis not present

## 2015-05-27 DIAGNOSIS — H401122 Primary open-angle glaucoma, left eye, moderate stage: Secondary | ICD-10-CM | POA: Diagnosis not present

## 2015-06-10 DIAGNOSIS — Z1389 Encounter for screening for other disorder: Secondary | ICD-10-CM | POA: Diagnosis not present

## 2015-06-10 DIAGNOSIS — Z0001 Encounter for general adult medical examination with abnormal findings: Secondary | ICD-10-CM | POA: Diagnosis not present

## 2015-06-10 DIAGNOSIS — R319 Hematuria, unspecified: Secondary | ICD-10-CM | POA: Diagnosis not present

## 2015-06-10 DIAGNOSIS — R972 Elevated prostate specific antigen [PSA]: Secondary | ICD-10-CM | POA: Diagnosis not present

## 2015-08-14 DIAGNOSIS — N401 Enlarged prostate with lower urinary tract symptoms: Secondary | ICD-10-CM | POA: Diagnosis not present

## 2015-08-21 DIAGNOSIS — R338 Other retention of urine: Secondary | ICD-10-CM | POA: Diagnosis not present

## 2015-08-21 DIAGNOSIS — N401 Enlarged prostate with lower urinary tract symptoms: Secondary | ICD-10-CM | POA: Diagnosis not present

## 2015-08-21 DIAGNOSIS — R972 Elevated prostate specific antigen [PSA]: Secondary | ICD-10-CM | POA: Diagnosis not present

## 2015-08-27 DIAGNOSIS — H25013 Cortical age-related cataract, bilateral: Secondary | ICD-10-CM | POA: Diagnosis not present

## 2015-08-27 DIAGNOSIS — H2513 Age-related nuclear cataract, bilateral: Secondary | ICD-10-CM | POA: Diagnosis not present

## 2015-08-27 DIAGNOSIS — H401132 Primary open-angle glaucoma, bilateral, moderate stage: Secondary | ICD-10-CM | POA: Diagnosis not present

## 2015-09-05 DIAGNOSIS — H2512 Age-related nuclear cataract, left eye: Secondary | ICD-10-CM | POA: Diagnosis not present

## 2015-09-05 DIAGNOSIS — H25012 Cortical age-related cataract, left eye: Secondary | ICD-10-CM | POA: Diagnosis not present

## 2015-09-05 DIAGNOSIS — H25812 Combined forms of age-related cataract, left eye: Secondary | ICD-10-CM | POA: Diagnosis not present

## 2015-11-11 DIAGNOSIS — L209 Atopic dermatitis, unspecified: Secondary | ICD-10-CM | POA: Diagnosis not present

## 2015-11-11 DIAGNOSIS — W57XXXA Bitten or stung by nonvenomous insect and other nonvenomous arthropods, initial encounter: Secondary | ICD-10-CM | POA: Diagnosis not present

## 2015-11-18 DIAGNOSIS — K7689 Other specified diseases of liver: Secondary | ICD-10-CM | POA: Diagnosis not present

## 2015-11-18 DIAGNOSIS — R338 Other retention of urine: Secondary | ICD-10-CM | POA: Diagnosis not present

## 2015-11-18 DIAGNOSIS — Z0000 Encounter for general adult medical examination without abnormal findings: Secondary | ICD-10-CM | POA: Diagnosis not present

## 2015-11-18 DIAGNOSIS — R972 Elevated prostate specific antigen [PSA]: Secondary | ICD-10-CM | POA: Diagnosis not present

## 2015-11-18 DIAGNOSIS — N3289 Other specified disorders of bladder: Secondary | ICD-10-CM | POA: Diagnosis not present

## 2016-02-10 DIAGNOSIS — H401122 Primary open-angle glaucoma, left eye, moderate stage: Secondary | ICD-10-CM | POA: Diagnosis not present

## 2016-02-10 DIAGNOSIS — H401111 Primary open-angle glaucoma, right eye, mild stage: Secondary | ICD-10-CM | POA: Diagnosis not present

## 2016-04-14 DIAGNOSIS — Z23 Encounter for immunization: Secondary | ICD-10-CM | POA: Diagnosis not present

## 2016-06-12 DIAGNOSIS — Z79899 Other long term (current) drug therapy: Secondary | ICD-10-CM | POA: Diagnosis not present

## 2016-06-12 DIAGNOSIS — K594 Anal spasm: Secondary | ICD-10-CM | POA: Diagnosis not present

## 2016-06-12 DIAGNOSIS — N400 Enlarged prostate without lower urinary tract symptoms: Secondary | ICD-10-CM | POA: Diagnosis not present

## 2016-06-12 DIAGNOSIS — R319 Hematuria, unspecified: Secondary | ICD-10-CM | POA: Diagnosis not present

## 2016-06-12 DIAGNOSIS — Z1389 Encounter for screening for other disorder: Secondary | ICD-10-CM | POA: Diagnosis not present

## 2016-06-12 DIAGNOSIS — R972 Elevated prostate specific antigen [PSA]: Secondary | ICD-10-CM | POA: Diagnosis not present

## 2016-06-12 DIAGNOSIS — R600 Localized edema: Secondary | ICD-10-CM | POA: Diagnosis not present

## 2016-06-12 DIAGNOSIS — Z0000 Encounter for general adult medical examination without abnormal findings: Secondary | ICD-10-CM | POA: Diagnosis not present

## 2016-06-22 DIAGNOSIS — H401122 Primary open-angle glaucoma, left eye, moderate stage: Secondary | ICD-10-CM | POA: Diagnosis not present

## 2016-06-22 DIAGNOSIS — H2511 Age-related nuclear cataract, right eye: Secondary | ICD-10-CM | POA: Diagnosis not present

## 2016-06-22 DIAGNOSIS — H401111 Primary open-angle glaucoma, right eye, mild stage: Secondary | ICD-10-CM | POA: Diagnosis not present

## 2016-06-22 DIAGNOSIS — H5201 Hypermetropia, right eye: Secondary | ICD-10-CM | POA: Diagnosis not present

## 2016-10-19 DIAGNOSIS — H401122 Primary open-angle glaucoma, left eye, moderate stage: Secondary | ICD-10-CM | POA: Diagnosis not present

## 2016-10-19 DIAGNOSIS — H401111 Primary open-angle glaucoma, right eye, mild stage: Secondary | ICD-10-CM | POA: Diagnosis not present

## 2016-10-19 DIAGNOSIS — H2511 Age-related nuclear cataract, right eye: Secondary | ICD-10-CM | POA: Diagnosis not present

## 2016-10-22 DIAGNOSIS — L02419 Cutaneous abscess of limb, unspecified: Secondary | ICD-10-CM | POA: Diagnosis not present

## 2016-10-29 DIAGNOSIS — B353 Tinea pedis: Secondary | ICD-10-CM | POA: Diagnosis not present

## 2016-10-29 DIAGNOSIS — L03115 Cellulitis of right lower limb: Secondary | ICD-10-CM | POA: Diagnosis not present

## 2016-11-05 DIAGNOSIS — N1330 Unspecified hydronephrosis: Secondary | ICD-10-CM | POA: Diagnosis not present

## 2016-11-05 DIAGNOSIS — N401 Enlarged prostate with lower urinary tract symptoms: Secondary | ICD-10-CM | POA: Diagnosis not present

## 2016-11-05 DIAGNOSIS — R350 Frequency of micturition: Secondary | ICD-10-CM | POA: Diagnosis not present

## 2016-12-20 ENCOUNTER — Emergency Department (HOSPITAL_COMMUNITY): Payer: Medicare Other

## 2016-12-20 ENCOUNTER — Encounter (HOSPITAL_COMMUNITY): Payer: Self-pay | Admitting: Emergency Medicine

## 2016-12-20 ENCOUNTER — Emergency Department (HOSPITAL_COMMUNITY)
Admission: EM | Admit: 2016-12-20 | Discharge: 2016-12-21 | Disposition: A | Discharge status: Home / Self Care | Payer: Medicare Other | Attending: Emergency Medicine | Admitting: Emergency Medicine

## 2016-12-20 DIAGNOSIS — Z79899 Other long term (current) drug therapy: Secondary | ICD-10-CM | POA: Insufficient documentation

## 2016-12-20 DIAGNOSIS — R05 Cough: Secondary | ICD-10-CM | POA: Insufficient documentation

## 2016-12-20 DIAGNOSIS — Z7982 Long term (current) use of aspirin: Secondary | ICD-10-CM | POA: Diagnosis not present

## 2016-12-20 DIAGNOSIS — J9811 Atelectasis: Secondary | ICD-10-CM | POA: Diagnosis not present

## 2016-12-20 DIAGNOSIS — R509 Fever, unspecified: Secondary | ICD-10-CM | POA: Insufficient documentation

## 2016-12-20 HISTORY — DX: Cellulitis, unspecified: L03.90

## 2016-12-20 LAB — COMPREHENSIVE METABOLIC PANEL
ALBUMIN: 3.7 g/dL (ref 3.5–5.0)
ALK PHOS: 137 U/L — AB (ref 38–126)
ALT: 18 U/L (ref 17–63)
ANION GAP: 6 (ref 5–15)
AST: 22 U/L (ref 15–41)
BILIRUBIN TOTAL: 0.7 mg/dL (ref 0.3–1.2)
BUN: 10 mg/dL (ref 6–20)
CALCIUM: 9.1 mg/dL (ref 8.9–10.3)
CO2: 24 mmol/L (ref 22–32)
Chloride: 105 mmol/L (ref 101–111)
Creatinine, Ser: 1.19 mg/dL (ref 0.61–1.24)
GFR, EST NON AFRICAN AMERICAN: 56 mL/min — AB (ref >60)
GLUCOSE: 149 mg/dL — AB (ref 65–99)
POTASSIUM: 4.2 mmol/L (ref 3.5–5.1)
Sodium: 135 mmol/L (ref 135–145)
TOTAL PROTEIN: 6.4 g/dL — AB (ref 6.5–8.1)

## 2016-12-20 LAB — CBC WITH DIFFERENTIAL/PLATELET
BASOS ABS: 0 10*3/uL (ref 0.0–0.1)
BASOS PCT: 0 %
EOS ABS: 0 10*3/uL (ref 0.0–0.7)
Eosinophils Relative: 0 %
HEMATOCRIT: 39.5 % (ref 39.0–52.0)
HEMOGLOBIN: 13.5 g/dL (ref 13.0–17.0)
Lymphocytes Relative: 5 %
Lymphs Abs: 0.6 10*3/uL — ABNORMAL LOW (ref 0.7–4.0)
MCH: 29.3 pg (ref 26.0–34.0)
MCHC: 34.2 g/dL (ref 30.0–36.0)
MCV: 85.9 fL (ref 78.0–100.0)
MONOS PCT: 3 %
Monocytes Absolute: 0.3 10*3/uL (ref 0.1–1.0)
NEUTROS ABS: 11 10*3/uL — AB (ref 1.7–7.7)
NEUTROS PCT: 92 %
Platelets: 157 10*3/uL (ref 150–400)
RBC: 4.6 MIL/uL (ref 4.22–5.81)
RDW: 14 % (ref 11.5–15.5)
WBC: 11.9 10*3/uL — ABNORMAL HIGH (ref 4.0–10.5)

## 2016-12-20 LAB — URINALYSIS, ROUTINE W REFLEX MICROSCOPIC
Bilirubin Urine: NEGATIVE
GLUCOSE, UA: NEGATIVE mg/dL
HGB URINE DIPSTICK: NEGATIVE
KETONES UR: NEGATIVE mg/dL
LEUKOCYTES UA: NEGATIVE
Nitrite: NEGATIVE
PROTEIN: NEGATIVE mg/dL
Specific Gravity, Urine: 1.011 (ref 1.005–1.030)
pH: 6 (ref 5.0–8.0)

## 2016-12-20 LAB — PROTIME-INR
INR: 1.08
PROTHROMBIN TIME: 14 s (ref 11.4–15.2)

## 2016-12-20 LAB — I-STAT CG4 LACTIC ACID, ED: LACTIC ACID, VENOUS: 1.49 mmol/L (ref 0.5–1.9)

## 2016-12-20 MED ORDER — ACETAMINOPHEN 325 MG PO TABS
ORAL_TABLET | ORAL | Status: AC
  Administered 2016-12-20: 650 mg via ORAL
  Filled 2016-12-20: qty 2

## 2016-12-20 MED ORDER — ACETAMINOPHEN 325 MG PO TABS
650.0000 mg | ORAL_TABLET | Freq: Once | ORAL | Status: AC | PRN
  Administered 2016-12-20: 650 mg via ORAL

## 2016-12-20 MED ORDER — SODIUM CHLORIDE 0.9 % IV BOLUS (SEPSIS)
2000.0000 mL | Freq: Once | INTRAVENOUS | Status: AC
  Administered 2016-12-20: 2000 mL via INTRAVENOUS

## 2016-12-20 MED ORDER — DEXTROSE 5 % IV SOLN
1.0000 g | Freq: Once | INTRAVENOUS | Status: DC
  Filled 2016-12-20: qty 10

## 2016-12-20 MED ORDER — DOXYCYCLINE HYCLATE 100 MG PO TABS
100.0000 mg | ORAL_TABLET | Freq: Once | ORAL | Status: AC
  Administered 2016-12-20: 100 mg via ORAL
  Filled 2016-12-20: qty 1

## 2016-12-20 NOTE — ED Notes (Signed)
Delay in lab draw pt not in room. 

## 2016-12-20 NOTE — ED Triage Notes (Signed)
Pt states he is here for cellulitis to his R foot but denies skin redness.  States he saw his doctor at the beginning of the year for same.  Denies any symptoms other than generalized weakness, lower back pain, and feeling "woozy".  Wife brought to triage room and she reports temp 103.7 at home today.  States pt had cellulitis to R foot in May and symptoms started the same way prior to redness to foot.  Asked wife if pt has been confused and she said that he has been all day today but usually is not. States he has lost car keys. Difficulty "emptying bladder" x 1 year but states he sees urologist and it has gotten better.  Pt reports urinating and having to go again in 15 minutes.

## 2016-12-20 NOTE — ED Provider Notes (Signed)
[facility]-EMERGENCY DEPT Provider Note   CSN: [id number] Arrival date & time: 12/20/16  2138  By signing my name below, I, Cynda Acres, attest that this documentation has been prepared under the direction and in the presence of Zadie Rhine, MD. Electronically Signed: Cynda Acres, Scribe. 12/20/16. 11:17 PM.  History   Chief Complaint Chief Complaint  Patient presents with  . Fever   HPI Comments: Luis Romero is a 82 y.o. male with no pertinent PMHx, who presents to the Emergency Department complaining of a fever 103.7 max that began earlier tonight. Patient does report a recent tick bite to the left chest and right posterior knee. Patient reports associated fatigue, mild cough, and chills. Patient denies any recent travel or any additional rashes.   Last cellulitis exacerbation was one month ago. He has been admitted for this problem in the past. Wife states his symptoms are consistent with prior cellulitis outbreaks. Patient usually is treated with a course of antibiotics as an outpatient. No erythema noted.   The history is provided by the patient. No language interpreter was used.  Fever   This is a new problem. The current episode started 3 to 5 hours ago. The problem occurs rarely. The problem has not changed since onset.The maximum temperature noted was 103 to 104 F (103.7). The temperature was taken using an oral thermometer. Associated symptoms include muscle aches and cough (mild). Pertinent negatives include no chest pain, no diarrhea, no vomiting, no headaches and no sore throat. Treatments tried: aleve. The treatment provided no relief.    Past Medical History:  Diagnosis Date  . Cellulitis     There are no active problems to display for this patient.   History reviewed. No pertinent surgical history.     Home Medications    Prior to Admission medications   Medication Sig Start Date End Date Taking? Authorizing Provider  aspirin 81 MG chewable tablet Chew  162 mg by mouth once.   Yes [provider]  latanoprost (XALATAN) 0.005 % ophthalmic solution Place 1 drop into both eyes at bedtime. 12/20/16  Yes [provider]  naproxen sodium (ANAPROX) 220 MG tablet Take 220 mg by mouth 2 (two) times daily as needed (for pain).   Yes [provider]  tamsulosin (FLOMAX) 0.4 MG CAPS capsule Take 0.4 mg by mouth daily. 11/30/16  Yes [provider]  timolol (TIMOPTIC) 0.5 % ophthalmic solution Place 1 drop into both eyes.  11/18/16  Yes [provider]    Family History No family history on file.  Social History Social History  Substance Use Topics  . Smoking status: Never Smoker  . Smokeless tobacco: Never Used  . Alcohol use No     Allergies   Patient has no known allergies.   Review of Systems Review of Systems  Constitutional: Positive for chills, fatigue and fever.  HENT: Negative for sore throat.   Respiratory: Positive for cough (mild).   Cardiovascular: Negative for chest pain.  Gastrointestinal: Negative for abdominal pain, diarrhea and vomiting.  Genitourinary: Negative for dysuria.  Musculoskeletal: Positive for back pain (lower). Negative for neck pain and neck stiffness.  Neurological: Negative for headaches.  All other systems reviewed and are negative.    Physical Exam Updated Vital Signs BP 122/62   Pulse 83   Temp (!) 101.9 F (38.8 C) (Oral)   Resp 16   Wt 156 lb 3 oz (70.8 kg)   SpO2 97%   Physical Exam  CONSTITUTIONAL: Well  developed/well nourished HEAD: Normocephalic/atraumatic EYES: EOMI/PERRL ENMT: Mucous membranes moist NECK: supple no meningeal signs SPINE/BACK:entire spine nontender CV: S1/S2 noted, no murmurs/rubs/gallops noted LUNGS: Lungs are clear to auscultation bilaterally, no apparent distress ABDOMEN: soft, nontender, no rebound or guarding, bowel sounds noted throughout abdomen GU:no cva tenderness NEURO: Pt is awake/alert/appropriate, moves  all extremitiesx4.  No facial droop.   EXTREMITIES: pulses normal/equal, full ROM. No rash,no signs of cellulitis. One small scab noted to the left axilla and one scab noted behind the right knee. No abscess noted.  SKIN: warm, color normal PSYCH: no abnormalities of mood noted, alert and oriented to situation  ED Treatments / Results  DIAGNOSTIC STUDIES: Oxygen Saturation is 97% on RA, normal by my interpretation.    COORDINATION OF CARE: 11:15 PM Discussed treatment plan with pt at bedside and pt agreed to plan, which includes IV fluids and antibiotics.   Labs (all labs ordered are listed, but only abnormal results are displayed) Labs Reviewed  COMPREHENSIVE METABOLIC PANEL - Abnormal; Notable for the following:       Result Value   Glucose, Bld 149 (*)    Total Protein 6.4 (*)    Alkaline Phosphatase 137 (*)    GFR calc non Af Amer 56 (*)    All other components within normal limits  CBC WITH DIFFERENTIAL/PLATELET - Abnormal; Notable for the following:    WBC 11.9 (*)    Neutro Abs 11.0 (*)    Lymphs Abs 0.6 (*)    All other components within normal limits  CULTURE, BLOOD (ROUTINE X 2)  CULTURE, BLOOD (ROUTINE X 2)  PROTIME-INR  URINALYSIS, ROUTINE W REFLEX MICROSCOPIC  ROCKY MTN SPOTTED FVR ABS PNL(IGG+IGM)  I-STAT CG4 LACTIC ACID, ED    EKG  EKG Interpretation None       Radiology Dg Chest 2 View  Result Date: 12/20/2016 CLINICAL DATA:  Generalized weakness. Question sepsis. Fever at home. EXAM: CHEST  2 VIEW COMPARISON:  Radiograph 06/20/2009 FINDINGS: The cardiomediastinal contours are normal. Mild atherosclerosis of the aortic arch. Streaky bibasilar atelectasis. Pulmonary vasculature is normal. No consolidation, pleural effusion, or pneumothorax. No acute osseous abnormalities are seen. IMPRESSION: Streaky bibasilar atelectasis.  No evidence of pneumonia. Mild aortic arch atherosclerosis. Electronically Signed   By: Rubye Oaks M.D.   On: 12/20/2016 23:05     Procedures Procedures   Medications Ordered in ED Medications  acetaminophen (TYLENOL) tablet 650 mg (650 mg Oral Given 12/20/16 2206)  sodium chloride 0.9 % bolus 2,000 mL (2,000 mLs Intravenous New Bag/Given 12/20/16 2319)  doxycycline (VIBRA-TABS) tablet 100 mg (100 mg Oral Given 12/20/16 2354)     Initial Impression / Assessment and Plan / ED Course  I have reviewed the triage vital signs and the nursing notes.  Pertinent labs & imaging results that were available during my care of the patient were reviewed by me and considered in my medical decision making (see chart for details).     Pt in the ED for fever of unclear etiology Denies significant cough/vomiting/diarrhea No HA/neck stiffness or signs of meningitis He is well appearing/nontoxic He is not septic appearing Significant history of recent tick bite Will start doxycycline Advised close PCP followup this week He would like to be discharged  Pt thought he had cellulitis but this is not seen clinically  Discussed strict ER precautions with patient/wife including HA/vomiting over next 1-2 days All pertinent questions answered prior to discharge Patient appropriate and safe for discharge at this time  Final Clinical Impressions(s) / ED Diagnoses   Final diagnoses:  Acute febrile illness    New Prescriptions New Prescriptions   DOXYCYCLINE (VIBRAMYCIN) 100 MG CAPSULE    Take 1 capsule (100 mg total) by mouth 2 (two) times daily.   I personally performed the services described in this documentation, which was scribed in my presence. The recorded information has been reviewed and is accurate.        Zadie RhineWickline, Jazari Ober, MD 12/21/16 [phone]

## 2016-12-21 DIAGNOSIS — R509 Fever, unspecified: Secondary | ICD-10-CM | POA: Diagnosis not present

## 2016-12-21 MED ORDER — DOXYCYCLINE HYCLATE 100 MG PO CAPS: 100.0000 mg | ORAL_CAPSULE | Freq: Two times a day (BID) | ORAL | 0 refills | Status: DC

## 2016-12-22 DIAGNOSIS — L03115 Cellulitis of right lower limb: Secondary | ICD-10-CM | POA: Diagnosis not present

## 2016-12-22 DIAGNOSIS — B353 Tinea pedis: Secondary | ICD-10-CM | POA: Diagnosis not present

## 2016-12-22 LAB — ROCKY MTN SPOTTED FVR ABS PNL(IGG+IGM)
RMSF IGM: 0.22 {index} (ref 0.00–0.89)
RMSF IgG: NEGATIVE

## 2016-12-25 LAB — CULTURE, BLOOD (ROUTINE X 2)
CULTURE: NO GROWTH
Culture: NO GROWTH
SPECIAL REQUESTS: ADEQUATE
SPECIAL REQUESTS: ADEQUATE

## 2017-04-02 DIAGNOSIS — Z23 Encounter for immunization: Secondary | ICD-10-CM | POA: Diagnosis not present

## 2017-04-05 DIAGNOSIS — B880 Other acariasis: Secondary | ICD-10-CM | POA: Diagnosis not present

## 2017-04-10 DIAGNOSIS — L03115 Cellulitis of right lower limb: Secondary | ICD-10-CM | POA: Diagnosis not present

## 2017-04-10 DIAGNOSIS — B372 Candidiasis of skin and nail: Secondary | ICD-10-CM | POA: Diagnosis not present

## 2017-05-01 ENCOUNTER — Observation Stay (HOSPITAL_COMMUNITY)
Admission: EM | Admit: 2017-05-01 | Discharge: 2017-05-02 | Disposition: A | Discharge status: Home / Self Care | Payer: Medicare Other | Attending: Internal Medicine | Admitting: Internal Medicine

## 2017-05-01 ENCOUNTER — Encounter (HOSPITAL_COMMUNITY): Payer: Self-pay | Admitting: *Deleted

## 2017-05-01 DIAGNOSIS — L03115 Cellulitis of right lower limb: Secondary | ICD-10-CM | POA: Diagnosis not present

## 2017-05-01 DIAGNOSIS — B353 Tinea pedis: Secondary | ICD-10-CM | POA: Diagnosis not present

## 2017-05-01 DIAGNOSIS — L03116 Cellulitis of left lower limb: Secondary | ICD-10-CM | POA: Diagnosis not present

## 2017-05-01 DIAGNOSIS — L0390 Cellulitis, unspecified: Secondary | ICD-10-CM | POA: Diagnosis present

## 2017-05-01 DIAGNOSIS — Z7982 Long term (current) use of aspirin: Secondary | ICD-10-CM | POA: Diagnosis not present

## 2017-05-01 DIAGNOSIS — Z79899 Other long term (current) drug therapy: Secondary | ICD-10-CM | POA: Diagnosis not present

## 2017-05-01 DIAGNOSIS — L089 Local infection of the skin and subcutaneous tissue, unspecified: Secondary | ICD-10-CM

## 2017-05-01 DIAGNOSIS — L0889 Other specified local infections of the skin and subcutaneous tissue: Secondary | ICD-10-CM | POA: Diagnosis not present

## 2017-05-01 LAB — CBC WITH DIFFERENTIAL/PLATELET
Basophils Absolute: 0 10*3/uL (ref 0.0–0.1)
Basophils Relative: 0 %
EOS PCT: 2 %
Eosinophils Absolute: 0.1 10*3/uL (ref 0.0–0.7)
HEMATOCRIT: 35.6 % — AB (ref 39.0–52.0)
Hemoglobin: 12.1 g/dL — ABNORMAL LOW (ref 13.0–17.0)
LYMPHS PCT: 17 %
Lymphs Abs: 1.3 10*3/uL (ref 0.7–4.0)
MCH: 28.8 pg (ref 26.0–34.0)
MCHC: 34 g/dL (ref 30.0–36.0)
MCV: 84.8 fL (ref 78.0–100.0)
MONO ABS: 0.8 10*3/uL (ref 0.1–1.0)
MONOS PCT: 10 %
NEUTROS ABS: 5.3 10*3/uL (ref 1.7–7.7)
Neutrophils Relative %: 71 %
PLATELETS: 184 10*3/uL (ref 150–400)
RBC: 4.2 MIL/uL — ABNORMAL LOW (ref 4.22–5.81)
RDW: 14.8 % (ref 11.5–15.5)
WBC: 7.5 10*3/uL (ref 4.0–10.5)

## 2017-05-01 LAB — BASIC METABOLIC PANEL
Anion gap: 6 (ref 5–15)
BUN: 6 mg/dL (ref 6–20)
CHLORIDE: 103 mmol/L (ref 101–111)
CO2: 26 mmol/L (ref 22–32)
Calcium: 8.7 mg/dL — ABNORMAL LOW (ref 8.9–10.3)
Creatinine, Ser: 0.98 mg/dL (ref 0.61–1.24)
GFR calc non Af Amer: >60 mL/min (ref >60)
Glucose, Bld: 101 mg/dL — ABNORMAL HIGH (ref 65–99)
Potassium: 4.1 mmol/L (ref 3.5–5.1)
Sodium: 135 mmol/L (ref 135–145)

## 2017-05-01 MED ORDER — TAMSULOSIN HCL 0.4 MG PO CAPS
0.4000 mg | ORAL_CAPSULE | Freq: Every day | ORAL | Status: DC
  Administered 2017-05-02: 0.4 mg via ORAL
  Filled 2017-05-01: qty 1

## 2017-05-01 MED ORDER — VANCOMYCIN HCL IN DEXTROSE 1-5 GM/200ML-% IV SOLN
1000.0000 mg | Freq: Once | INTRAVENOUS | Status: AC
  Administered 2017-05-01: 1000 mg via INTRAVENOUS
  Filled 2017-05-01: qty 200

## 2017-05-01 MED ORDER — ONDANSETRON HCL 4 MG PO TABS: 4.0000 mg | ORAL_TABLET | Freq: Four times a day (QID) | ORAL | Status: DC | PRN

## 2017-05-01 MED ORDER — ACETAMINOPHEN 650 MG RE SUPP: 650.0000 mg | Freq: Four times a day (QID) | RECTAL | Status: DC | PRN

## 2017-05-01 MED ORDER — LATANOPROST 0.005 % OP SOLN
1.0000 [drp] | Freq: Every day | OPHTHALMIC | Status: DC
  Administered 2017-05-01: 1 [drp] via OPHTHALMIC
  Filled 2017-05-01: qty 2.5

## 2017-05-01 MED ORDER — ACETAMINOPHEN 325 MG PO TABS: 650.0000 mg | ORAL_TABLET | Freq: Four times a day (QID) | ORAL | Status: DC | PRN

## 2017-05-01 MED ORDER — ENOXAPARIN SODIUM 40 MG/0.4ML ~~LOC~~ SOLN
40.0000 mg | SUBCUTANEOUS | Status: DC
  Administered 2017-05-01: 40 mg via SUBCUTANEOUS
  Filled 2017-05-01: qty 0.4

## 2017-05-01 MED ORDER — HYDROCODONE-ACETAMINOPHEN 5-325 MG PO TABS: 1.0000 | ORAL_TABLET | ORAL | Status: DC | PRN

## 2017-05-01 MED ORDER — ONDANSETRON HCL 4 MG/2ML IJ SOLN: 4.0000 mg | Freq: Four times a day (QID) | INTRAMUSCULAR | Status: DC | PRN

## 2017-05-01 MED ORDER — TIMOLOL MALEATE 0.5 % OP SOLN
1.0000 [drp] | Freq: Every day | OPHTHALMIC | Status: DC
  Administered 2017-05-02: 1 [drp] via OPHTHALMIC
  Filled 2017-05-01: qty 5

## 2017-05-01 MED ORDER — CLINDAMYCIN PHOSPHATE 600 MG/50ML IV SOLN
600.0000 mg | Freq: Three times a day (TID) | INTRAVENOUS | Status: DC
  Administered 2017-05-01 – 2017-05-02 (×3): 600 mg via INTRAVENOUS
  Filled 2017-05-01 (×3): qty 50

## 2017-05-01 MED ORDER — FLUCONAZOLE 100 MG PO TABS
150.0000 mg | ORAL_TABLET | Freq: Once | ORAL | Status: AC
  Administered 2017-05-01: 150 mg via ORAL
  Filled 2017-05-01: qty 2

## 2017-05-01 MED ORDER — CLOTRIMAZOLE 1 % EX CREA
TOPICAL_CREAM | Freq: Two times a day (BID) | CUTANEOUS | Status: DC
  Administered 2017-05-01 – 2017-05-02 (×2): via TOPICAL
  Filled 2017-05-01: qty 15

## 2017-05-01 MED ORDER — CLINDAMYCIN PHOSPHATE 600 MG/50ML IV SOLN
600.0000 mg | Freq: Three times a day (TID) | INTRAVENOUS | Status: DC
  Administered 2017-05-01: 600 mg via INTRAVENOUS
  Filled 2017-05-01: qty 50

## 2017-05-01 MED ORDER — SODIUM CHLORIDE 0.9 % IV BOLUS (SEPSIS)
500.0000 mL | Freq: Once | INTRAVENOUS | Status: AC
  Administered 2017-05-01: 500 mL via INTRAVENOUS

## 2017-05-01 NOTE — H&P (Signed)
History and Physical    Luis RubensBobby S Hedeen ZOX:[id number] DOB: 03/15/1935 DOA: 05/01/2017  PCP: Marden NobleGates, Robert, MD   Patient coming from: Home  Chief Complaint: Right lower leg redness, spreading up to thigh   HPI: Luis Romero is a 82 y.o. male who generally enjoys good health and denies any significant past medical history, now presenting to the emergency department for evaluation of recurrent redness, swelling, and pain involving the lower right leg.  Patient reports that he had similar symptoms back in 2010 that resolved with antibiotics, and has had occasional recurrences since that time.  He developed a recurrence of the symptoms approximately 1 month ago, was evaluated by his PCP after several days and started on an antibiotic for 10 days (patient believes it was Keflex).  He enjoyed some improvement with this initially, but now reports rapid worsening with increased redness, pain, and swelling, tracking up the leg and now involving the medial right thigh.  Denies fevers or chills.  Denies any purulent drainage.  Has never had a lower extremity ultrasound.  Denies any personal or family history of VTE.  ED Course: Upon arrival to the ED, patient is found to be afebrile, saturating well on room air, and with vitals otherwise stable.  Chemistry panel and CBC are unremarkable.  The patient was given 500 cc of normal saline, glucan, and vancomycin.  He remained hemodynamically stable and in no apparent respiratory distress.  He will be observed on the medical-surgical unit for ongoing evaluation and management of recurrent cellulitis that is rapidly progressing proximally from the ankle, now up into the medial right thigh.  Review of Systems:  All other systems reviewed and apart from HPI, are negative.  Past Medical History:  Diagnosis Date  . Cellulitis     History reviewed. No pertinent surgical history.   reports that he has never smoked. He has never used smokeless tobacco. He reports  that he does not drink alcohol or use drugs.  No Known Allergies  History reviewed. No pertinent family history.   Prior to Admission medications   Medication Sig Start Date End Date Taking? Authorizing Provider  aspirin 81 MG chewable tablet Chew 162 mg by mouth once.    [provider]  latanoprost (XALATAN) 0.005 % ophthalmic solution Place 1 drop into both eyes at bedtime. 12/20/16   [provider]  naproxen sodium (ANAPROX) 220 MG tablet Take 220 mg by mouth 2 (two) times daily as needed (for pain).    [provider]  tamsulosin (FLOMAX) 0.4 MG CAPS capsule Take 0.4 mg by mouth daily. 11/30/16   [provider]  timolol (TIMOPTIC) 0.5 % ophthalmic solution Place 1 drop into both eyes.  11/18/16   [provider]    Physical Exam: Vitals:   05/01/17 1730 05/01/17 1800 05/01/17 1830 05/01/17 1900  BP: 101/75 106/76 117/85 105/70  Pulse: 61 62 63 64  Resp:      Temp:      TempSrc:      SpO2: 99% 100% 99% 98%      Constitutional: NAD, calm Eyes: PERTLA, lids and conjunctivae normal ENMT: Mucous membranes are moist. Posterior pharynx clear of any exudate or lesions.   Neck: normal, supple, no masses, no thyromegaly Respiratory: clear to auscultation bilaterally, no wheezing, no crackles. Normal respiratory effort.    Cardiovascular: S1 & S2 heard, regular rate and rhythm. No significant JVD. Abdomen: No distension, no tenderness, no masses palpated. Bowel sounds normal.  Musculoskeletal: no  clubbing / cyanosis. No joint deformity upper and lower extremities.    Skin: Lower right leg is edematous, erythematous, hot, and tender; no purulence or drainage. Interdigital erosions, maceration, and fissures involving bilateral feet, Rt > Lt. Skin is otherwise warm, dry, well-perfused. Neurologic: CN 2-12 grossly intact. Sensation intact. Strength 5/5 in all 4 limbs.  Psychiatric: Alert and oriented x 3. Pleasant, cooperative.     Labs on  Admission: I have personally reviewed following labs and imaging studies  CBC:  Recent Labs Lab 05/01/17 1258  WBC 7.5  NEUTROABS 5.3  HGB 12.1*  HCT 35.6*  MCV 84.8  PLT 184   Basic Metabolic Panel:  Recent Labs Lab 05/01/17 1258  NA 135  K 4.1  CL 103  CO2 26  GLUCOSE 101*  BUN 6  CREATININE 0.98  CALCIUM 8.7*   GFR: CrCl cannot be calculated (Unknown ideal weight.). Liver Function Tests: No results for input(s): AST, ALT, ALKPHOS, BILITOT, PROT, ALBUMIN in the last 168 hours. No results for input(s): LIPASE, AMYLASE in the last 168 hours. No results for input(s): AMMONIA in the last 168 hours. Coagulation Profile: No results for input(s): INR, PROTIME in the last 168 hours. Cardiac Enzymes: No results for input(s): CKTOTAL, CKMB, CKMBINDEX, TROPONINI in the last 168 hours. BNP (last 3 results) No results for input(s): PROBNP in the last 8760 hours. HbA1C: No results for input(s): HGBA1C in the last 72 hours. CBG: No results for input(s): GLUCAP in the last 168 hours. Lipid Profile: No results for input(s): CHOL, HDL, LDLCALC, TRIG, CHOLHDL, LDLDIRECT in the last 72 hours. Thyroid Function Tests: No results for input(s): TSH, T4TOTAL, FREET4, T3FREE, THYROIDAB in the last 72 hours. Anemia Panel: No results for input(s): VITAMINB12, FOLATE, FERRITIN, TIBC, IRON, RETICCTPCT in the last 72 hours. Urine analysis:    Component Value Date/Time   COLORURINE YELLOW 12/20/2016 2150   APPEARANCEUR CLEAR 12/20/2016 2150   LABSPEC 1.011 12/20/2016 2150   PHURINE 6.0 12/20/2016 2150   GLUCOSEU NEGATIVE 12/20/2016 2150   HGBUR NEGATIVE 12/20/2016 2150   BILIRUBINUR NEGATIVE 12/20/2016 2150   KETONESUR NEGATIVE 12/20/2016 2150   PROTEINUR NEGATIVE 12/20/2016 2150   UROBILINOGEN 0.2 06/17/2009 1611   NITRITE NEGATIVE 12/20/2016 2150   LEUKOCYTESUR NEGATIVE 12/20/2016 2150   Sepsis Labs: @LABRCNTIP (procalcitonin:4,lacticidven:4) )No results found for this or any  previous visit (from the past 240 hour(s)).   Radiological Exams on Admission: No results found.  EKG: Not performed.   Assessment/Plan  1. Cellulitis  - Pt presents with recurrent cellulitis involving lower right leg  - He has recently completed a 10-day course of abx (he thinks it was Keflex), had partial improvement, but has re-worsened with rapid progression proximally to the medial thigh   - No systemic infectious s/s, and no underlying prosthesis in RLE or prosthetic valves, but given the rapid progression, hospital observation for IV abx is indicated  - Plan to treat with clindamycin IV, treat associated tinea pedis as below, rule-out underlying DVT with [place], elevate leg    2. Tinea pedis  - Bilateral interdigital erosions, scale, fissures noted; pt reports associated pruritis  - This puts him at risk for recurrent cellulitis and was treated in ED with Diflucan 150 mg  - Continue treatment with clotrimazole topical BID, check LFT's in am and consider continuing Diflucan weekly    DVT prophylaxis: Lovenox Code Status: Full  Family Communication: Discussed with patient Disposition Plan: Observe on med-surg Consults called: None Admission status: Observation  Briscoe Deutscher, MD [facility] Pager [phone]  If 7PM-7AM, please contact night-coverage [email or web address] Password [email or web address]  05/01/2017, 7:51 PM

## 2017-05-01 NOTE — ED Notes (Signed)
Report called to [facility] on 5w

## 2017-05-01 NOTE — Progress Notes (Signed)
Attempted to call for report.  No answer, call rolled over to front desk.  Will attempt again in a few minutes.  Room is currently being cleaned.

## 2017-05-01 NOTE — ED Provider Notes (Signed)
[facility] EMERGENCY DEPARTMENT Provider Note   CSN: [id number] Arrival date & time: 05/01/17  1239 History   Chief Complaint Chief Complaint  Patient presents with  . Recurrent Skin Infections   HPI Luis Romero is a 82 y.o. male who presents with complaint of redness to his right lower extremity.  Patient states symptoms started 2 days ago.  Patient states that he has had multiple episodes of cellulitis over the past 4 months approximately 4 episodes requiring antibiotics.  Patient states most recent episode was on 04/10/2017 in which she received 10 days of Keflex.  Reports that symptoms improved however never completely resolved.  Over the past 2 days erythema has rapidly spread up the right lower leg.  Denies that he has had any recent fevers or chills.  Has had some increased swelling in the right lower extremity associated with redness.  The history is provided by the patient.   Past Medical History:  Diagnosis Date  . Cellulitis   There are no active problems to display for this patient.  History reviewed. No pertinent surgical history.  Home Medications    Prior to Admission medications   Medication Sig Start Date End Date Taking? Authorizing Provider  aspirin 81 MG chewable tablet Chew 162 mg by mouth once.    [provider]  doxycycline (VIBRAMYCIN) 100 MG capsule Take 1 capsule (100 mg total) by mouth 2 (two) times daily. 12/21/16   Zadie Rhine, MD  latanoprost (XALATAN) 0.005 % ophthalmic solution Place 1 drop into both eyes at bedtime. 12/20/16   [provider]  naproxen sodium (ANAPROX) 220 MG tablet Take 220 mg by mouth 2 (two) times daily as needed (for pain).    [provider]  tamsulosin (FLOMAX) 0.4 MG CAPS capsule Take 0.4 mg by mouth daily. 11/30/16   [provider]  timolol (TIMOPTIC) 0.5 % ophthalmic solution Place 1 drop into both eyes.  11/18/16   [provider]   Family History No  family history on file.  Social History Social History  Substance Use Topics  . Smoking status: Never Smoker  . Smokeless tobacco: Never Used  . Alcohol use No   Allergies   Patient has no known allergies.  Review of Systems Review of Systems  Constitutional: Negative for chills and fever.  HENT: Negative for ear pain and sore throat.   Eyes: Negative for pain and visual disturbance.  Respiratory: Negative for cough.   Cardiovascular: Negative for palpitations.  Gastrointestinal: Negative for vomiting.  Genitourinary: Negative for dysuria and hematuria.  Musculoskeletal: Negative for arthralgias and back pain.  Skin: Positive for color change. Negative for rash.  Neurological: Negative for seizures and syncope.  All other systems reviewed and are negative.  Physical Exam Updated Vital Signs BP 113/76   Pulse 70   Temp 98.1 F (36.7 C) (Oral)   Resp 17   SpO2 100%   Physical Exam  Constitutional: He is oriented to person, place, and time. He appears well-developed and well-nourished.  HENT:  Head: Normocephalic and atraumatic.  Eyes: Pupils are equal, round, and reactive to light. Conjunctivae and EOM are normal.  Neck: Normal range of motion. Neck supple.  Cardiovascular: Normal rate and regular rhythm.   No murmur heard. Pulmonary/Chest: Effort normal and breath sounds normal. No respiratory distress.  Abdominal: Soft. Bowel sounds are normal. He exhibits no distension. There is no tenderness.  Musculoskeletal: Normal range of motion. He exhibits no edema.  Marked erythema extending  from the ankle over the anterior aspect of the right leg to the bottom of the knee  Neurological: He is alert and oriented to person, place, and time.  Skin: Skin is warm and dry. There is erythema.  Findings consistent with tinea pedis   Psychiatric: He has a normal mood and affect.  Nursing note and vitals reviewed.  ED Treatments / Results  Labs (all labs ordered are listed, but  only abnormal results are displayed) Labs Reviewed  CBC WITH DIFFERENTIAL/PLATELET - Abnormal; Notable for the following:       Result Value   RBC 4.20 (*)    Hemoglobin 12.1 (*)    HCT 35.6 (*)    All other components within normal limits  BASIC METABOLIC PANEL - Abnormal; Notable for the following:    Glucose, Bld 101 (*)    Calcium 8.7 (*)    All other components within normal limits   EKG  EKG Interpretation None      Radiology No results found.  Procedures Procedures (including critical care time)  Medications Ordered in ED Medications - No data to display  Initial Impression / Assessment and Plan / ED Course  I have reviewed the triage vital signs and the nursing notes.  Pertinent labs & imaging results that were available during my care of the patient were reviewed by me and considered in my medical decision making (see chart for details).  Luis Romero is a 82 y.o. male who presented to ED with concerns for a skin infection.   Likely cellulitis.   Doubt erysipelas, impetigo, SSSS, TSS, SJS, nec fasc, abscess, hidradenitis suppurative, cat scratch.   Patient noted to have athlete's foot likely responsible for the recurrent bouts of cellulitis.   Will treat with diflucan and vancomycin.  Due to rapid progression of erythema to the right lower extremity and failure of outpatient antibiotics at this time consider admission for IV antibiotics reasonable. Discussed this patient's care with the hospitalist who evaluated patient in the emergency department and agrees with the plan.  The plan for this patient was discussed with Dr. Jodi MourningZavitz, who voiced agreement and who oversaw evaluation and treatment of this patient.   Final Clinical Impressions(s) / ED Diagnoses   Final diagnoses:  Skin infection  Cellulitis of right lower extremity    New Prescriptions New Prescriptions   No medications on file     Lamont SnowballGarza, Blayne Frankie, MD 05/02/17 [id number]    Blane OharaZavitz, Joshua,  MD 05/04/17 [phone]

## 2017-05-01 NOTE — ED Notes (Signed)
Attempted report unable ot give  Nurse will call me back

## 2017-05-01 NOTE — ED Triage Notes (Signed)
Pt c/o cellulities reoccuring to R lower leg, [facility], pt denies fever & chills, pt reports R leg cellulitis x 4 this year, pt has swelling and redness present that extends the entire anterior lower leg, pt has peeling to R foot, A&O x4

## 2017-05-01 NOTE — ED Notes (Signed)
Pt resting.

## 2017-05-01 NOTE — ED Notes (Signed)
Iv meds infusing. pts wife has gone home

## 2017-05-01 NOTE — ED Notes (Signed)
Th ept has a redness and swelling ro the rt lower leg since yesterday  He has had these problems since 2010  It gets better then worse.  He had a good pedal pulse  He describes the pain as a tightness

## 2017-05-01 NOTE — ED Notes (Signed)
Food given sandwich and coke

## 2017-05-02 ENCOUNTER — Observation Stay (HOSPITAL_BASED_OUTPATIENT_CLINIC_OR_DEPARTMENT_OTHER): Payer: Medicare Other

## 2017-05-02 DIAGNOSIS — M7989 Other specified soft tissue disorders: Secondary | ICD-10-CM | POA: Diagnosis not present

## 2017-05-02 DIAGNOSIS — L03115 Cellulitis of right lower limb: Secondary | ICD-10-CM | POA: Diagnosis not present

## 2017-05-02 LAB — CBC WITH DIFFERENTIAL/PLATELET
Basophils Absolute: 0 10*3/uL (ref 0.0–0.1)
Basophils Relative: 0 %
EOS PCT: 6 %
Eosinophils Absolute: 0.3 10*3/uL (ref 0.0–0.7)
HCT: 31.9 % — ABNORMAL LOW (ref 39.0–52.0)
Hemoglobin: 10.8 g/dL — ABNORMAL LOW (ref 13.0–17.0)
LYMPHS ABS: 1.1 10*3/uL (ref 0.7–4.0)
LYMPHS PCT: 22 %
MCH: 28.6 pg (ref 26.0–34.0)
MCHC: 33.9 g/dL (ref 30.0–36.0)
MCV: 84.4 fL (ref 78.0–100.0)
MONO ABS: 0.7 10*3/uL (ref 0.1–1.0)
Monocytes Relative: 14 %
NEUTROS PCT: 58 %
Neutro Abs: 2.9 10*3/uL (ref 1.7–7.7)
PLATELETS: 182 10*3/uL (ref 150–400)
RBC: 3.78 MIL/uL — AB (ref 4.22–5.81)
RDW: 15 % (ref 11.5–15.5)
WBC: 5.1 10*3/uL (ref 4.0–10.5)

## 2017-05-02 LAB — COMPREHENSIVE METABOLIC PANEL
ALT: 16 U/L — AB (ref 17–63)
AST: 16 U/L (ref 15–41)
Albumin: 2.8 g/dL — ABNORMAL LOW (ref 3.5–5.0)
Alkaline Phosphatase: 86 U/L (ref 38–126)
Anion gap: 7 (ref 5–15)
BILIRUBIN TOTAL: 0.9 mg/dL (ref 0.3–1.2)
BUN: 7 mg/dL (ref 6–20)
CHLORIDE: 104 mmol/L (ref 101–111)
CO2: 26 mmol/L (ref 22–32)
CREATININE: 0.86 mg/dL (ref 0.61–1.24)
Calcium: 8.5 mg/dL — ABNORMAL LOW (ref 8.9–10.3)
Glucose, Bld: 85 mg/dL (ref 65–99)
Potassium: 4 mmol/L (ref 3.5–5.1)
Sodium: 137 mmol/L (ref 135–145)
Total Protein: 5.2 g/dL — ABNORMAL LOW (ref 6.5–8.1)

## 2017-05-02 LAB — HIV ANTIBODY (ROUTINE TESTING W REFLEX): HIV SCREEN 4TH GENERATION: NONREACTIVE

## 2017-05-02 MED ORDER — TERBINAFINE HCL 1 % EX CREA
TOPICAL_CREAM | Freq: Two times a day (BID) | CUTANEOUS | Status: DC
  Administered 2017-05-02: 14:00:00 via TOPICAL
  Filled 2017-05-02: qty 12

## 2017-05-02 MED ORDER — CLOTRIMAZOLE 1 % EX CREA: TOPICAL_CREAM | Freq: Two times a day (BID) | CUTANEOUS | 0 refills | Status: DC

## 2017-05-02 MED ORDER — SACCHAROMYCES BOULARDII 250 MG PO CAPS: 250.0000 mg | ORAL_CAPSULE | Freq: Two times a day (BID) | ORAL | 0 refills | Status: DC

## 2017-05-02 MED ORDER — CLINDAMYCIN HCL 300 MG PO CAPS: 600.0000 mg | ORAL_CAPSULE | Freq: Three times a day (TID) | ORAL | 0 refills | Status: AC

## 2017-05-02 MED ORDER — TERBINAFINE HCL 1 % EX CREA: TOPICAL_CREAM | Freq: Two times a day (BID) | CUTANEOUS | 0 refills | Status: AC

## 2017-05-02 MED ORDER — CLINDAMYCIN HCL 300 MG PO CAPS: 600.0000 mg | ORAL_CAPSULE | Freq: Three times a day (TID) | ORAL | 0 refills | Status: DC

## 2017-05-02 MED ORDER — SACCHAROMYCES BOULARDII 250 MG PO CAPS: 250.0000 mg | ORAL_CAPSULE | Freq: Two times a day (BID) | ORAL | 0 refills | Status: AC

## 2017-05-02 MED ORDER — TERBINAFINE HCL 1 % EX CREA
TOPICAL_CREAM | Freq: Two times a day (BID) | CUTANEOUS | Status: DC
  Filled 2017-05-02: qty 12

## 2017-05-02 MED ORDER — TERBINAFINE HCL 1 % EX CREA: TOPICAL_CREAM | Freq: Two times a day (BID) | CUTANEOUS | 0 refills | Status: DC

## 2017-05-02 NOTE — Progress Notes (Signed)
VASCULAR LAB PRELIMINARY  PRELIMINARY  PRELIMINARY  PRELIMINARY  Right lower extremity venous duplex completed.    Preliminary report:  There is no DVT or SVT noted in the right lower extremity.  Full preliminary results may be found under results review.  Marquez Ceesay, RVT 05/02/2017, 10:32 AM

## 2017-05-02 NOTE — Progress Notes (Signed)
Nsg Discharge Note  Admit Date:  05/01/2017 Discharge date: 05/02/2017   Ileene RubensBobby S Flippen to be D/C'd Home per MD order.  AVS completed.  Copy for chart, and copy for patient signed, and dated. Patient/caregiver able to verbalize understanding.  Discharge Medication: Allergies as of 05/02/2017   No Known Allergies     Medication List    TAKE these medications   clindamycin 300 MG capsule Commonly known as:  CLEOCIN Take 2 capsules (600 mg total) by mouth 3 (three) times daily.   latanoprost 0.005 % ophthalmic solution Commonly known as:  XALATAN Place 1 drop into both eyes at bedtime.   naproxen sodium 220 MG tablet Commonly known as:  ANAPROX Take 220 mg by mouth 2 (two) times daily as needed (for pain).   saccharomyces boulardii 250 MG capsule Commonly known as:  FLORASTOR Take 1 capsule (250 mg total) by mouth 2 (two) times daily.   tamsulosin 0.4 MG Caps capsule Commonly known as:  FLOMAX Take 0.4 mg by mouth daily.   terbinafine 1 % cream Commonly known as:  LAMISIL Apply topically 2 (two) times daily.   timolol 0.5 % ophthalmic solution Commonly known as:  TIMOPTIC Place 1 drop into both eyes every morning.       Discharge Assessment: Vitals:   05/02/17 1104 05/02/17 1445  BP: 103/69 109/68  Pulse: 73 74  Resp:  18  Temp:  98.2 F (36.8 C)  SpO2:     Skin clean, dry and intact without evidence of skin break down, no evidence of skin tears noted. IV catheter discontinued intact. Site without signs and symptoms of complications - no redness or edema noted at insertion site, patient denies c/o pain - only slight tenderness at site.  Dressing with slight pressure applied.  D/c Instructions-Education: Discharge instructions given to patient/family with verbalized understanding. D/c education completed with patient/family including follow up instructions, medication list, d/c activities limitations if indicated, with other d/c instructions as indicated by MD  - patient able to verbalize understanding, all questions fully answered. Patient instructed to return to ED, call 911, or call MD for any changes in condition.  Patient escorted via WC, and D/C home via private auto.  Camillo FlamingVicki L Josseline Reddin, RN 05/02/2017 4:58 PM

## 2017-05-03 NOTE — Discharge Summary (Signed)
[facility] Discharge Summary   Patient: Luis Romero ZOX:[id number]   PCP: Marden Noble, MD DOB: [birth year]   Date of admission: 05/01/2017   Date of discharge: 05/02/2017    Discharge Diagnoses:  Principal Problem:   Cellulitis Active Problems:   Tinea pedis   Admitted From: home Disposition:  home  Recommendations for Outpatient Follow-up:  1. Follow-up with PCP in 2 weeks.  Follow-up Information    Marden Noble, MD. Schedule an appointment as soon as possible for a visit in 2 week(s).   Specialty:  Internal Medicine Contact information: [address] [place] [address] [phone]          Diet recommendation: Regular diet  Activity: The patient is advised to gradually reintroduce usual activities.  Discharge Condition: good  Code Status: Full code  History of present illness: As per the H and P dictated on admission, "Luis Romero is a 82 y.o. male who generally enjoys good health and denies any significant past medical history, now presenting to the emergency department for evaluation of recurrent redness, swelling, and pain involving the lower right leg.  Patient reports that he had similar symptoms back in 2010 that resolved with antibiotics, and has had occasional recurrences since that time.  He developed a recurrence of the symptoms approximately 1 month ago, was evaluated by his PCP after several days and started on an antibiotic for 10 days (patient believes it was Keflex).  He enjoyed some improvement with this initially, but now reports rapid worsening with increased redness, pain, and swelling, tracking up the leg and now involving the medial right thigh.  Denies fevers or chills.  Denies any purulent drainage.  Has never had a lower extremity ultrasound.  Denies any personal or family history of VTE.  ED Course: Upon arrival to the ED, patient is found to be afebrile, saturating well on room air, and with vitals otherwise  stable.  Chemistry panel and CBC are unremarkable.  The patient was given 500 cc of normal saline, glucan, and vancomycin.  He remained hemodynamically stable and in no apparent respiratory distress.  He will be observed on the medical-surgical unit for ongoing evaluation and management of recurrent cellulitis that is rapidly progressing proximally from the ankle, now up into the medial right thigh."  Hospital Course:  Summary of his active problems in the hospital is as following. Principal Problem:   Cellulitis Active Problems:   Tinea pedis The patient presents with complaints of her recurrent cellulitis.  He mentions that this is his fourth episode this year. It affects his right leg starts at the swelling and after the treatment of cellulitis the swelling totally resolved and the leg becomes normal. Patient denies any significant tenderness or difficulty walking around. Ultrasound lower extremity Doppler was negative for any DVT, ruptured Baker's cyst or any other acute abnormality. At present it is felt that the source of the infection is tenia pedis and therefore we will treat the patient with terbinafine ointment. I will also treat patient with antibiotics for 1 week. Recommend patient to follow-up with PCP after finishing the treatment to ensure resolution of the symptoms. If not patient may require further workup for unilateral leg swelling.  All other chronic medical condition were stable during the hospitalization.  Patient was ambulatory without any assistance. On the day of the discharge the patient's vitals were stable, and no other acute medical condition were reported by patient. the patient was felt safe to be discharge  at home with family.  Procedures and Results:  Lower extremity doppler   Consultations:  none  DISCHARGE MEDICATION: Discharge Medication List as of 05/02/2017  2:46 PM    CONTINUE these medications which have CHANGED   Details  clindamycin  (CLEOCIN) 300 MG capsule Take 2 capsules (600 mg total) by mouth 3 (three) times daily., Starting Sun 05/02/2017, Until Sun 05/09/2017, Print    saccharomyces boulardii (FLORASTOR) 250 MG capsule Take 1 capsule (250 mg total) by mouth 2 (two) times daily., Starting Sun 05/02/2017, Print    terbinafine (LAMISIL) 1 % cream Apply topically 2 (two) times daily., Starting Sun 05/02/2017, Print      CONTINUE these medications which have NOT CHANGED   Details  latanoprost (XALATAN) 0.005 % ophthalmic solution Place 1 drop into both eyes at bedtime., Starting Sun 12/20/2016, Historical Med    naproxen sodium (ANAPROX) 220 MG tablet Take 220 mg by mouth 2 (two) times daily as needed (for pain)., Historical Med    tamsulosin (FLOMAX) 0.4 MG CAPS capsule Take 0.4 mg by mouth daily., Starting Mon 11/30/2016, Historical Med    timolol (TIMOPTIC) 0.5 % ophthalmic solution Place 1 drop into both eyes every morning. , Starting Wed 11/18/2016, Historical Med      STOP taking these medications     clotrimazole (LOTRIMIN) 1 % cream        No Known Allergies Discharge Instructions    Diet general    Complete by:  As directed    Discharge instructions    Complete by:  As directed    It is important that you read following instructions as well as go over your medication list with RN to help you understand your care after this hospitalization.  Discharge Instructions: Please follow-up with PCP in one week  Please request your primary care physician to go over all Hospital Tests and Procedure/Radiological results at the follow up,  Please get all Hospital records sent to your PCP by signing hospital release before you go home.   Do not take more than prescribed Pain, Sleep and Anxiety Medications. You were cared for by a hospitalist during your hospital stay. If you have any questions about your discharge medications or the care you received while you were in the hospital after you are discharged, you can  call the unit and ask to speak with the hospitalist on call if the hospitalist that took care of you is not available.  Once you are discharged, your primary care physician will handle any further medical issues. Please note that NO REFILLS for any discharge medications will be authorized once you are discharged, as it is imperative that you return to your primary care physician (or establish a relationship with a primary care physician if you do not have one) for your aftercare needs so that they can reassess your need for medications and monitor your lab values. You Must read complete instructions/literature along with all the possible adverse reactions/side effects for all the Medicines you take and that have been prescribed to you. Take any new Medicines after you have completely understood and accept all the possible adverse reactions/side effects. Wear Seat belts while driving. If you have smoked or chewed Tobacco in the last 2 yrs please stop smoking and/or stop any Recreational drug use.   Increase activity slowly    Complete by:  As directed      Discharge Exam: Filed Weights   05/01/17 2122  Weight: 70.6 kg (155 lb 11.2 oz)  Vitals:   05/02/17 1104 05/02/17 1445  BP: 103/69 109/68  Pulse: 73 74  Resp:  18  Temp:  98.2 F (36.8 C)  SpO2:     General: Appear in no distress, no Rash; Oral Mucosa moist. Cardiovascular: S1 and S2 Present, no Murmur, no JVD Respiratory: Bilateral Air entry present and Clear to Auscultation, no Crackles, no wheezes Abdomen: Bowel Sound present, Soft and no tenderness Extremities: right Pedal edema, no calf tenderness Neurology: Grossly no focal neuro deficit.  The results of significant diagnostics from this hospitalization (including imaging, microbiology, ancillary and laboratory) are listed below for reference.    Significant Diagnostic Studies: No results found.  Microbiology: No results found for this or any previous visit (from the past  240 hour(s)).   Labs: CBC:  Recent Labs Lab 05/01/17 1258 05/02/17 0419  WBC 7.5 5.1  NEUTROABS 5.3 2.9  HGB 12.1* 10.8*  HCT 35.6* 31.9*  MCV 84.8 84.4  PLT 184 182   Basic Metabolic Panel:  Recent Labs Lab 05/01/17 1258 05/02/17 0419  NA 135 137  K 4.1 4.0  CL 103 104  CO2 26 26  GLUCOSE 101* 85  BUN 6 7  CREATININE 0.98 0.86  CALCIUM 8.7* 8.5*   Liver Function Tests:  Recent Labs Lab 05/02/17 0419  AST 16  ALT 16*  ALKPHOS 86  BILITOT 0.9  PROT 5.2*  ALBUMIN 2.8*   Time spent: 35 minutes  Signed:  Kammie Scioli  [facility] 05/02/2017 , 2:03 PM

## 2017-05-06 DIAGNOSIS — L03115 Cellulitis of right lower limb: Secondary | ICD-10-CM | POA: Diagnosis not present

## 2017-05-06 DIAGNOSIS — B353 Tinea pedis: Secondary | ICD-10-CM | POA: Diagnosis not present

## 2017-05-18 DIAGNOSIS — R7303 Prediabetes: Secondary | ICD-10-CM | POA: Diagnosis not present

## 2017-05-18 DIAGNOSIS — R600 Localized edema: Secondary | ICD-10-CM | POA: Diagnosis not present

## 2017-05-18 DIAGNOSIS — Z79899 Other long term (current) drug therapy: Secondary | ICD-10-CM | POA: Diagnosis not present

## 2017-05-18 DIAGNOSIS — B353 Tinea pedis: Secondary | ICD-10-CM | POA: Diagnosis not present

## 2017-05-18 DIAGNOSIS — L03115 Cellulitis of right lower limb: Secondary | ICD-10-CM | POA: Diagnosis not present

## 2017-06-18 DIAGNOSIS — R972 Elevated prostate specific antigen [PSA]: Secondary | ICD-10-CM | POA: Diagnosis not present

## 2017-06-18 DIAGNOSIS — Z0000 Encounter for general adult medical examination without abnormal findings: Secondary | ICD-10-CM | POA: Diagnosis not present

## 2017-06-18 DIAGNOSIS — Z79899 Other long term (current) drug therapy: Secondary | ICD-10-CM | POA: Diagnosis not present

## 2017-06-18 DIAGNOSIS — B353 Tinea pedis: Secondary | ICD-10-CM | POA: Diagnosis not present

## 2017-06-18 DIAGNOSIS — R7309 Other abnormal glucose: Secondary | ICD-10-CM | POA: Diagnosis not present

## 2017-06-18 DIAGNOSIS — R600 Localized edema: Secondary | ICD-10-CM | POA: Diagnosis not present

## 2017-06-18 DIAGNOSIS — D649 Anemia, unspecified: Secondary | ICD-10-CM | POA: Diagnosis not present

## 2017-06-18 DIAGNOSIS — L03115 Cellulitis of right lower limb: Secondary | ICD-10-CM | POA: Diagnosis not present

## 2017-06-18 DIAGNOSIS — R7303 Prediabetes: Secondary | ICD-10-CM | POA: Diagnosis not present

## 2017-06-18 DIAGNOSIS — K594 Anal spasm: Secondary | ICD-10-CM | POA: Diagnosis not present

## 2017-06-18 DIAGNOSIS — Z1389 Encounter for screening for other disorder: Secondary | ICD-10-CM | POA: Diagnosis not present

## 2017-06-18 DIAGNOSIS — N400 Enlarged prostate without lower urinary tract symptoms: Secondary | ICD-10-CM | POA: Diagnosis not present

## 2017-08-04 DIAGNOSIS — H02054 Trichiasis without entropian left upper eyelid: Secondary | ICD-10-CM | POA: Diagnosis not present

## 2017-08-04 DIAGNOSIS — H11442 Conjunctival cysts, left eye: Secondary | ICD-10-CM | POA: Diagnosis not present

## 2017-08-04 DIAGNOSIS — H04122 Dry eye syndrome of left lacrimal gland: Secondary | ICD-10-CM | POA: Diagnosis not present

## 2017-09-07 DIAGNOSIS — H52203 Unspecified astigmatism, bilateral: Secondary | ICD-10-CM | POA: Diagnosis not present

## 2017-09-07 DIAGNOSIS — H401122 Primary open-angle glaucoma, left eye, moderate stage: Secondary | ICD-10-CM | POA: Diagnosis not present

## 2017-09-07 DIAGNOSIS — H401111 Primary open-angle glaucoma, right eye, mild stage: Secondary | ICD-10-CM | POA: Diagnosis not present

## 2017-09-07 DIAGNOSIS — H2511 Age-related nuclear cataract, right eye: Secondary | ICD-10-CM | POA: Diagnosis not present

## 2017-11-08 DIAGNOSIS — N401 Enlarged prostate with lower urinary tract symptoms: Secondary | ICD-10-CM | POA: Diagnosis not present

## 2017-11-08 DIAGNOSIS — R338 Other retention of urine: Secondary | ICD-10-CM | POA: Diagnosis not present

## 2017-11-08 DIAGNOSIS — N390 Urinary tract infection, site not specified: Secondary | ICD-10-CM | POA: Diagnosis not present

## 2017-11-08 DIAGNOSIS — R350 Frequency of micturition: Secondary | ICD-10-CM | POA: Diagnosis not present

## 2018-03-09 DIAGNOSIS — H401111 Primary open-angle glaucoma, right eye, mild stage: Secondary | ICD-10-CM | POA: Diagnosis not present

## 2018-03-09 DIAGNOSIS — H401122 Primary open-angle glaucoma, left eye, moderate stage: Secondary | ICD-10-CM | POA: Diagnosis not present

## 2018-03-25 DIAGNOSIS — Z23 Encounter for immunization: Secondary | ICD-10-CM | POA: Diagnosis not present

## 2018-06-20 DIAGNOSIS — Z13220 Encounter for screening for lipoid disorders: Secondary | ICD-10-CM | POA: Diagnosis not present

## 2018-06-20 DIAGNOSIS — Z0000 Encounter for general adult medical examination without abnormal findings: Secondary | ICD-10-CM | POA: Diagnosis not present

## 2018-06-20 DIAGNOSIS — R972 Elevated prostate specific antigen [PSA]: Secondary | ICD-10-CM | POA: Diagnosis not present

## 2018-06-20 DIAGNOSIS — Z79899 Other long term (current) drug therapy: Secondary | ICD-10-CM | POA: Diagnosis not present

## 2018-06-20 DIAGNOSIS — B353 Tinea pedis: Secondary | ICD-10-CM | POA: Diagnosis not present

## 2018-06-20 DIAGNOSIS — Z1389 Encounter for screening for other disorder: Secondary | ICD-10-CM | POA: Diagnosis not present

## 2018-06-20 DIAGNOSIS — D649 Anemia, unspecified: Secondary | ICD-10-CM | POA: Diagnosis not present

## 2018-06-20 DIAGNOSIS — R7303 Prediabetes: Secondary | ICD-10-CM | POA: Diagnosis not present

## 2018-06-20 DIAGNOSIS — K594 Anal spasm: Secondary | ICD-10-CM | POA: Diagnosis not present

## 2018-06-20 DIAGNOSIS — N400 Enlarged prostate without lower urinary tract symptoms: Secondary | ICD-10-CM | POA: Diagnosis not present

## 2018-11-23 DIAGNOSIS — R350 Frequency of micturition: Secondary | ICD-10-CM | POA: Diagnosis not present

## 2018-11-23 DIAGNOSIS — N1330 Unspecified hydronephrosis: Secondary | ICD-10-CM | POA: Diagnosis not present

## 2018-11-23 DIAGNOSIS — R3914 Feeling of incomplete bladder emptying: Secondary | ICD-10-CM | POA: Diagnosis not present

## 2019-03-30 DIAGNOSIS — Z23 Encounter for immunization: Secondary | ICD-10-CM | POA: Diagnosis not present

## 2019-05-24 DIAGNOSIS — N401 Enlarged prostate with lower urinary tract symptoms: Secondary | ICD-10-CM | POA: Diagnosis not present

## 2019-05-24 DIAGNOSIS — N1330 Unspecified hydronephrosis: Secondary | ICD-10-CM | POA: Diagnosis not present

## 2019-06-07 DIAGNOSIS — H43813 Vitreous degeneration, bilateral: Secondary | ICD-10-CM | POA: Diagnosis not present

## 2019-06-07 DIAGNOSIS — H401122 Primary open-angle glaucoma, left eye, moderate stage: Secondary | ICD-10-CM | POA: Diagnosis not present

## 2019-06-07 DIAGNOSIS — H2511 Age-related nuclear cataract, right eye: Secondary | ICD-10-CM | POA: Diagnosis not present

## 2019-06-07 DIAGNOSIS — H524 Presbyopia: Secondary | ICD-10-CM | POA: Diagnosis not present

## 2019-07-18 DIAGNOSIS — Z23 Encounter for immunization: Secondary | ICD-10-CM | POA: Diagnosis not present

## 2019-08-18 DIAGNOSIS — Z23 Encounter for immunization: Secondary | ICD-10-CM | POA: Diagnosis not present

## 2019-09-08 DIAGNOSIS — M5431 Sciatica, right side: Secondary | ICD-10-CM | POA: Diagnosis not present

## 2019-11-06 DIAGNOSIS — R7303 Prediabetes: Secondary | ICD-10-CM | POA: Diagnosis not present

## 2019-11-06 DIAGNOSIS — Z0001 Encounter for general adult medical examination with abnormal findings: Secondary | ICD-10-CM | POA: Diagnosis not present

## 2019-11-06 DIAGNOSIS — K594 Anal spasm: Secondary | ICD-10-CM | POA: Diagnosis not present

## 2019-11-06 DIAGNOSIS — Z1389 Encounter for screening for other disorder: Secondary | ICD-10-CM | POA: Diagnosis not present

## 2019-11-06 DIAGNOSIS — N400 Enlarged prostate without lower urinary tract symptoms: Secondary | ICD-10-CM | POA: Diagnosis not present

## 2019-11-06 DIAGNOSIS — R972 Elevated prostate specific antigen [PSA]: Secondary | ICD-10-CM | POA: Diagnosis not present

## 2019-11-06 DIAGNOSIS — D649 Anemia, unspecified: Secondary | ICD-10-CM | POA: Diagnosis not present

## 2019-11-06 DIAGNOSIS — Z23 Encounter for immunization: Secondary | ICD-10-CM | POA: Diagnosis not present

## 2019-11-06 DIAGNOSIS — B353 Tinea pedis: Secondary | ICD-10-CM | POA: Diagnosis not present

## 2019-11-06 DIAGNOSIS — L301 Dyshidrosis [pompholyx]: Secondary | ICD-10-CM | POA: Diagnosis not present

## 2019-11-06 DIAGNOSIS — Z79899 Other long term (current) drug therapy: Secondary | ICD-10-CM | POA: Diagnosis not present

## 2020-01-29 DIAGNOSIS — H401122 Primary open-angle glaucoma, left eye, moderate stage: Secondary | ICD-10-CM | POA: Diagnosis not present

## 2020-01-29 DIAGNOSIS — H401111 Primary open-angle glaucoma, right eye, mild stage: Secondary | ICD-10-CM | POA: Diagnosis not present

## 2020-03-27 DIAGNOSIS — Z23 Encounter for immunization: Secondary | ICD-10-CM | POA: Diagnosis not present

## 2020-05-01 DIAGNOSIS — Z23 Encounter for immunization: Secondary | ICD-10-CM | POA: Diagnosis not present

## 2020-05-22 DIAGNOSIS — H2511 Age-related nuclear cataract, right eye: Secondary | ICD-10-CM | POA: Diagnosis not present

## 2020-05-22 DIAGNOSIS — H26492 Other secondary cataract, left eye: Secondary | ICD-10-CM | POA: Diagnosis not present

## 2020-05-22 DIAGNOSIS — H524 Presbyopia: Secondary | ICD-10-CM | POA: Diagnosis not present

## 2020-05-22 DIAGNOSIS — H401122 Primary open-angle glaucoma, left eye, moderate stage: Secondary | ICD-10-CM | POA: Diagnosis not present

## 2020-05-24 DIAGNOSIS — N1330 Unspecified hydronephrosis: Secondary | ICD-10-CM | POA: Diagnosis not present

## 2020-05-24 DIAGNOSIS — N401 Enlarged prostate with lower urinary tract symptoms: Secondary | ICD-10-CM | POA: Diagnosis not present

## 2020-05-24 DIAGNOSIS — R3914 Feeling of incomplete bladder emptying: Secondary | ICD-10-CM | POA: Diagnosis not present

## 2020-08-28 DIAGNOSIS — H2511 Age-related nuclear cataract, right eye: Secondary | ICD-10-CM | POA: Diagnosis not present

## 2020-08-28 DIAGNOSIS — H401122 Primary open-angle glaucoma, left eye, moderate stage: Secondary | ICD-10-CM | POA: Diagnosis not present

## 2020-08-28 DIAGNOSIS — H401111 Primary open-angle glaucoma, right eye, mild stage: Secondary | ICD-10-CM | POA: Diagnosis not present

## 2020-08-28 DIAGNOSIS — H25011 Cortical age-related cataract, right eye: Secondary | ICD-10-CM | POA: Diagnosis not present

## 2020-09-12 DIAGNOSIS — H25811 Combined forms of age-related cataract, right eye: Secondary | ICD-10-CM | POA: Diagnosis not present

## 2020-09-12 DIAGNOSIS — H2511 Age-related nuclear cataract, right eye: Secondary | ICD-10-CM | POA: Diagnosis not present

## 2020-09-12 DIAGNOSIS — H25011 Cortical age-related cataract, right eye: Secondary | ICD-10-CM | POA: Diagnosis not present

## 2020-10-24 DIAGNOSIS — Z23 Encounter for immunization: Secondary | ICD-10-CM | POA: Diagnosis not present

## 2020-11-06 DIAGNOSIS — K594 Anal spasm: Secondary | ICD-10-CM | POA: Diagnosis not present

## 2020-11-06 DIAGNOSIS — B353 Tinea pedis: Secondary | ICD-10-CM | POA: Diagnosis not present

## 2020-11-06 DIAGNOSIS — Z23 Encounter for immunization: Secondary | ICD-10-CM | POA: Diagnosis not present

## 2020-11-06 DIAGNOSIS — N400 Enlarged prostate without lower urinary tract symptoms: Secondary | ICD-10-CM | POA: Diagnosis not present

## 2020-11-06 DIAGNOSIS — Z0001 Encounter for general adult medical examination with abnormal findings: Secondary | ICD-10-CM | POA: Diagnosis not present

## 2020-11-06 DIAGNOSIS — L301 Dyshidrosis [pompholyx]: Secondary | ICD-10-CM | POA: Diagnosis not present

## 2020-11-06 DIAGNOSIS — D649 Anemia, unspecified: Secondary | ICD-10-CM | POA: Diagnosis not present

## 2020-11-06 DIAGNOSIS — R972 Elevated prostate specific antigen [PSA]: Secondary | ICD-10-CM | POA: Diagnosis not present

## 2020-11-06 DIAGNOSIS — Z79899 Other long term (current) drug therapy: Secondary | ICD-10-CM | POA: Diagnosis not present

## 2020-11-06 DIAGNOSIS — R7309 Other abnormal glucose: Secondary | ICD-10-CM | POA: Diagnosis not present

## 2020-11-29 DIAGNOSIS — H43821 Vitreomacular adhesion, right eye: Secondary | ICD-10-CM | POA: Diagnosis not present

## 2020-11-29 DIAGNOSIS — H26491 Other secondary cataract, right eye: Secondary | ICD-10-CM | POA: Diagnosis not present

## 2020-12-24 DIAGNOSIS — H26491 Other secondary cataract, right eye: Secondary | ICD-10-CM | POA: Diagnosis not present

## 2021-01-22 DIAGNOSIS — H04123 Dry eye syndrome of bilateral lacrimal glands: Secondary | ICD-10-CM | POA: Diagnosis not present

## 2021-01-22 DIAGNOSIS — H401122 Primary open-angle glaucoma, left eye, moderate stage: Secondary | ICD-10-CM | POA: Diagnosis not present

## 2021-03-21 DIAGNOSIS — Z23 Encounter for immunization: Secondary | ICD-10-CM | POA: Diagnosis not present

## 2021-04-09 DIAGNOSIS — Z23 Encounter for immunization: Secondary | ICD-10-CM | POA: Diagnosis not present

## 2021-06-04 DIAGNOSIS — N401 Enlarged prostate with lower urinary tract symptoms: Secondary | ICD-10-CM | POA: Diagnosis not present

## 2021-06-04 DIAGNOSIS — N1330 Unspecified hydronephrosis: Secondary | ICD-10-CM | POA: Diagnosis not present

## 2021-06-04 DIAGNOSIS — R3914 Feeling of incomplete bladder emptying: Secondary | ICD-10-CM | POA: Diagnosis not present

## 2021-06-10 DIAGNOSIS — H52203 Unspecified astigmatism, bilateral: Secondary | ICD-10-CM | POA: Diagnosis not present

## 2021-06-10 DIAGNOSIS — H04123 Dry eye syndrome of bilateral lacrimal glands: Secondary | ICD-10-CM | POA: Diagnosis not present

## 2021-06-10 DIAGNOSIS — H401122 Primary open-angle glaucoma, left eye, moderate stage: Secondary | ICD-10-CM | POA: Diagnosis not present

## 2021-06-10 DIAGNOSIS — H26492 Other secondary cataract, left eye: Secondary | ICD-10-CM | POA: Diagnosis not present

## 2021-06-24 DIAGNOSIS — H26492 Other secondary cataract, left eye: Secondary | ICD-10-CM | POA: Diagnosis not present

## 2021-11-12 DIAGNOSIS — H401132 Primary open-angle glaucoma, bilateral, moderate stage: Secondary | ICD-10-CM | POA: Diagnosis not present

## 2021-11-14 DIAGNOSIS — Z23 Encounter for immunization: Secondary | ICD-10-CM | POA: Diagnosis not present

## 2021-11-20 DIAGNOSIS — R972 Elevated prostate specific antigen [PSA]: Secondary | ICD-10-CM | POA: Diagnosis not present

## 2021-11-20 DIAGNOSIS — D649 Anemia, unspecified: Secondary | ICD-10-CM | POA: Diagnosis not present

## 2021-11-20 DIAGNOSIS — Z1331 Encounter for screening for depression: Secondary | ICD-10-CM | POA: Diagnosis not present

## 2021-11-20 DIAGNOSIS — L301 Dyshidrosis [pompholyx]: Secondary | ICD-10-CM | POA: Diagnosis not present

## 2021-11-20 DIAGNOSIS — B353 Tinea pedis: Secondary | ICD-10-CM | POA: Diagnosis not present

## 2021-11-20 DIAGNOSIS — K594 Anal spasm: Secondary | ICD-10-CM | POA: Diagnosis not present

## 2021-11-20 DIAGNOSIS — N400 Enlarged prostate without lower urinary tract symptoms: Secondary | ICD-10-CM | POA: Diagnosis not present

## 2021-11-20 DIAGNOSIS — Z79899 Other long term (current) drug therapy: Secondary | ICD-10-CM | POA: Diagnosis not present

## 2021-11-20 DIAGNOSIS — R7309 Other abnormal glucose: Secondary | ICD-10-CM | POA: Diagnosis not present

## 2021-11-20 DIAGNOSIS — Z7189 Other specified counseling: Secondary | ICD-10-CM | POA: Diagnosis not present

## 2021-11-20 DIAGNOSIS — Z0000 Encounter for general adult medical examination without abnormal findings: Secondary | ICD-10-CM | POA: Diagnosis not present

## 2021-11-20 DIAGNOSIS — R7303 Prediabetes: Secondary | ICD-10-CM | POA: Diagnosis not present

## 2021-12-09 DIAGNOSIS — H401112 Primary open-angle glaucoma, right eye, moderate stage: Secondary | ICD-10-CM | POA: Diagnosis not present

## 2022-01-13 DIAGNOSIS — D649 Anemia, unspecified: Secondary | ICD-10-CM | POA: Diagnosis not present

## 2022-01-19 DIAGNOSIS — D519 Vitamin B12 deficiency anemia, unspecified: Secondary | ICD-10-CM | POA: Diagnosis not present

## 2022-01-19 DIAGNOSIS — D649 Anemia, unspecified: Secondary | ICD-10-CM | POA: Diagnosis not present

## 2022-03-10 DIAGNOSIS — H401122 Primary open-angle glaucoma, left eye, moderate stage: Secondary | ICD-10-CM | POA: Diagnosis not present

## 2022-03-10 DIAGNOSIS — H401111 Primary open-angle glaucoma, right eye, mild stage: Secondary | ICD-10-CM | POA: Diagnosis not present

## 2022-03-31 DIAGNOSIS — Z23 Encounter for immunization: Secondary | ICD-10-CM | POA: Diagnosis not present

## 2022-04-18 DIAGNOSIS — Z23 Encounter for immunization: Secondary | ICD-10-CM | POA: Diagnosis not present

## 2022-06-04 DIAGNOSIS — K429 Umbilical hernia without obstruction or gangrene: Secondary | ICD-10-CM | POA: Diagnosis not present

## 2022-06-04 DIAGNOSIS — L989 Disorder of the skin and subcutaneous tissue, unspecified: Secondary | ICD-10-CM | POA: Diagnosis not present

## 2022-07-08 DIAGNOSIS — H524 Presbyopia: Secondary | ICD-10-CM | POA: Diagnosis not present

## 2022-07-08 DIAGNOSIS — N1330 Unspecified hydronephrosis: Secondary | ICD-10-CM | POA: Diagnosis not present

## 2022-07-08 DIAGNOSIS — R3914 Feeling of incomplete bladder emptying: Secondary | ICD-10-CM | POA: Diagnosis not present

## 2022-07-08 DIAGNOSIS — H401122 Primary open-angle glaucoma, left eye, moderate stage: Secondary | ICD-10-CM | POA: Diagnosis not present

## 2022-07-08 DIAGNOSIS — H52203 Unspecified astigmatism, bilateral: Secondary | ICD-10-CM | POA: Diagnosis not present

## 2022-07-08 DIAGNOSIS — H43813 Vitreous degeneration, bilateral: Secondary | ICD-10-CM | POA: Diagnosis not present

## 2022-07-08 DIAGNOSIS — H04123 Dry eye syndrome of bilateral lacrimal glands: Secondary | ICD-10-CM | POA: Diagnosis not present

## 2022-08-18 DIAGNOSIS — R799 Abnormal finding of blood chemistry, unspecified: Secondary | ICD-10-CM | POA: Diagnosis not present

## 2022-08-19 DIAGNOSIS — H401122 Primary open-angle glaucoma, left eye, moderate stage: Secondary | ICD-10-CM | POA: Diagnosis not present

## 2022-09-22 DIAGNOSIS — H401122 Primary open-angle glaucoma, left eye, moderate stage: Secondary | ICD-10-CM | POA: Diagnosis not present

## 2022-09-22 DIAGNOSIS — Z23 Encounter for immunization: Secondary | ICD-10-CM | POA: Diagnosis not present

## 2022-11-03 DIAGNOSIS — H401122 Primary open-angle glaucoma, left eye, moderate stage: Secondary | ICD-10-CM | POA: Diagnosis not present

## 2023-01-27 DIAGNOSIS — Z5181 Encounter for therapeutic drug level monitoring: Secondary | ICD-10-CM | POA: Diagnosis not present

## 2023-01-27 DIAGNOSIS — R7309 Other abnormal glucose: Secondary | ICD-10-CM | POA: Diagnosis not present

## 2023-01-27 DIAGNOSIS — R600 Localized edema: Secondary | ICD-10-CM | POA: Diagnosis not present

## 2023-01-27 DIAGNOSIS — D649 Anemia, unspecified: Secondary | ICD-10-CM | POA: Diagnosis not present

## 2023-01-27 DIAGNOSIS — Z0000 Encounter for general adult medical examination without abnormal findings: Secondary | ICD-10-CM | POA: Diagnosis not present

## 2023-01-27 DIAGNOSIS — R7303 Prediabetes: Secondary | ICD-10-CM | POA: Diagnosis not present

## 2023-03-29 DIAGNOSIS — Z23 Encounter for immunization: Secondary | ICD-10-CM | POA: Diagnosis not present

## 2023-04-15 DIAGNOSIS — Z23 Encounter for immunization: Secondary | ICD-10-CM | POA: Diagnosis not present

## 2023-05-03 DIAGNOSIS — H401132 Primary open-angle glaucoma, bilateral, moderate stage: Secondary | ICD-10-CM | POA: Diagnosis not present

## 2023-07-22 DIAGNOSIS — N1330 Unspecified hydronephrosis: Secondary | ICD-10-CM | POA: Diagnosis not present

## 2023-07-22 DIAGNOSIS — R350 Frequency of micturition: Secondary | ICD-10-CM | POA: Diagnosis not present

## 2023-07-22 DIAGNOSIS — N401 Enlarged prostate with lower urinary tract symptoms: Secondary | ICD-10-CM | POA: Diagnosis not present

## 2023-07-26 DIAGNOSIS — H5211 Myopia, right eye: Secondary | ICD-10-CM | POA: Diagnosis not present

## 2023-07-26 DIAGNOSIS — H401132 Primary open-angle glaucoma, bilateral, moderate stage: Secondary | ICD-10-CM | POA: Diagnosis not present

## 2023-10-26 DIAGNOSIS — Z23 Encounter for immunization: Secondary | ICD-10-CM | POA: Diagnosis not present

## 2024-03-30 DIAGNOSIS — Z23 Encounter for immunization: Secondary | ICD-10-CM | POA: Diagnosis not present

## 2024-04-07 DIAGNOSIS — B353 Tinea pedis: Secondary | ICD-10-CM | POA: Diagnosis not present

## 2024-04-07 DIAGNOSIS — Z0000 Encounter for general adult medical examination without abnormal findings: Secondary | ICD-10-CM | POA: Diagnosis not present

## 2024-04-07 DIAGNOSIS — R600 Localized edema: Secondary | ICD-10-CM | POA: Diagnosis not present

## 2024-04-07 DIAGNOSIS — Z5181 Encounter for therapeutic drug level monitoring: Secondary | ICD-10-CM | POA: Diagnosis not present

## 2024-04-07 DIAGNOSIS — R7303 Prediabetes: Secondary | ICD-10-CM | POA: Diagnosis not present

## 2024-04-07 DIAGNOSIS — Z136 Encounter for screening for cardiovascular disorders: Secondary | ICD-10-CM | POA: Diagnosis not present

## 2024-04-07 DIAGNOSIS — H409 Unspecified glaucoma: Secondary | ICD-10-CM | POA: Diagnosis not present

## 2024-04-13 DIAGNOSIS — Z23 Encounter for immunization: Secondary | ICD-10-CM | POA: Diagnosis not present

## 2024-05-02 DIAGNOSIS — H401132 Primary open-angle glaucoma, bilateral, moderate stage: Secondary | ICD-10-CM | POA: Diagnosis not present

## 2024-05-02 DIAGNOSIS — H04123 Dry eye syndrome of bilateral lacrimal glands: Secondary | ICD-10-CM | POA: Diagnosis not present

## 2024-05-02 DIAGNOSIS — H43813 Vitreous degeneration, bilateral: Secondary | ICD-10-CM | POA: Diagnosis not present

## 2024-05-02 DIAGNOSIS — Z961 Presence of intraocular lens: Secondary | ICD-10-CM | POA: Diagnosis not present

## 2024-05-23 DIAGNOSIS — R7989 Other specified abnormal findings of blood chemistry: Secondary | ICD-10-CM | POA: Diagnosis not present

## 2024-05-23 DIAGNOSIS — R946 Abnormal results of thyroid function studies: Secondary | ICD-10-CM | POA: Diagnosis not present
